# Patient Record
Sex: Female | Born: 1965 | Race: White | Hispanic: No | Marital: Married | Smoking: Current every day smoker
Health system: Southern US, Community
[De-identification: ages and names within clinical notes are randomized; demographics above are authoritative.]

## PROBLEM LIST (undated history)

## (undated) DIAGNOSIS — M549 Dorsalgia, unspecified: Secondary | ICD-10-CM

## (undated) DIAGNOSIS — G8929 Other chronic pain: Secondary | ICD-10-CM

## (undated) HISTORY — PX: TUBAL LIGATION: SHX77

---

## 2011-07-31 ENCOUNTER — Encounter: Payer: Self-pay | Admitting: *Deleted

## 2011-07-31 ENCOUNTER — Emergency Department (HOSPITAL_COMMUNITY)
Admission: EM | Admit: 2011-07-31 | Discharge: 2011-07-31 | Disposition: A | Discharge status: Home / Self Care | Payer: Self-pay | Attending: Emergency Medicine | Admitting: Emergency Medicine

## 2011-07-31 DIAGNOSIS — K029 Dental caries, unspecified: Secondary | ICD-10-CM | POA: Insufficient documentation

## 2011-07-31 DIAGNOSIS — R110 Nausea: Secondary | ICD-10-CM | POA: Insufficient documentation

## 2011-07-31 DIAGNOSIS — K0889 Other specified disorders of teeth and supporting structures: Secondary | ICD-10-CM

## 2011-07-31 DIAGNOSIS — K089 Disorder of teeth and supporting structures, unspecified: Secondary | ICD-10-CM | POA: Insufficient documentation

## 2011-07-31 MED ORDER — HYDROCODONE-ACETAMINOPHEN 5-325 MG PO TABS: 2.0000 | ORAL_TABLET | Freq: Four times a day (QID) | ORAL | Status: AC | PRN

## 2011-07-31 MED ORDER — PENICILLIN V POTASSIUM 250 MG PO TABS
500.0000 mg | ORAL_TABLET | Freq: Once | ORAL | Status: AC
  Administered 2011-07-31: 500 mg via ORAL
  Filled 2011-07-31: qty 2

## 2011-07-31 MED ORDER — PENICILLIN V POTASSIUM 500 MG PO TABS: 500.0000 mg | ORAL_TABLET | Freq: Four times a day (QID) | ORAL | Status: AC

## 2011-07-31 MED ORDER — HYDROCODONE-ACETAMINOPHEN 5-325 MG PO TABS
2.0000 | ORAL_TABLET | Freq: Once | ORAL | Status: AC
  Administered 2011-07-31: 2 via ORAL
  Filled 2011-07-31: qty 2

## 2011-07-31 NOTE — ED Notes (Signed)
Pt presents to department for evaluation of R upper molar toothache. Ongoing for several days. 10/10 pain at the time. No facial swelling. Pt alert and oriented x4. No signs of distress at the time.

## 2011-07-31 NOTE — ED Notes (Signed)
She has a broken tooth and she has had a toothache since last Thursday.  She has an appointment with a dentist next week

## 2011-07-31 NOTE — ED Provider Notes (Signed)
History     CSN: [id number] Arrival date & time: 07/31/2011  3:06 PM   First MD Initiated Contact with Patient 07/31/11 1730      Chief Complaint  Patient presents with  . Dental Pain    (Consider location/radiation/quality/duration/timing/severity/associated sxs/prior treatment) HPI 45 yo female with weeks of right maxillary dental pain associated with dental caries presents with 10/10 pain despite OTC meds.  Patient has no fevers or vomiting.  She has had some nausea but no trismus, difficulty swallowing, or breathing, or facial swelling.  Pain is worse with temperature changes and palpation.  Nothing has made it better.  The patient has an appointment with a dentist next week. History reviewed. No pertinent past medical history.  History reviewed. No pertinent past surgical history.  History reviewed. No pertinent family history.  History  Substance Use Topics  . Smoking status: Current Everyday Smoker  . Smokeless tobacco: Not on file  . Alcohol Use: No    OB History    Grav Para Term Preterm Abortions TAB SAB Ect Mult Living                  Review of Systems  Constitutional: Negative.   HENT: Positive for dental problem.   Eyes: Negative.   Respiratory: Negative.   Cardiovascular: Negative.   Gastrointestinal: Positive for nausea.  Genitourinary: Negative.   Musculoskeletal: Negative.   Skin: Negative.   Neurological: Negative.   Hematological: Negative.   Psychiatric/Behavioral: Negative.   All other systems reviewed and are negative.    Allergies  Sulfa antibiotics  Home Medications   Current Outpatient Rx  Name Route Sig Dispense Refill  . ACETAMINOPHEN 500 MG PO TABS Oral Take 1,000 mg by mouth every 6 (six) hours as needed. For pain      . BENZOCAINE 10 % MT GEL Mouth/Throat Use as directed 1 application in the mouth or throat 2 (two) times daily as needed. For toothache     . IBUPROFEN 200 MG PO TABS Oral Take 400 mg by mouth every 6 (six)  hours as needed. For pain     . HYDROCODONE-ACETAMINOPHEN 5-325 MG PO TABS Oral Take 2 tablets by mouth every 6 (six) hours as needed for pain. 30 tablet 0  . PENICILLIN V POTASSIUM 500 MG PO TABS Oral Take 1 tablet (500 mg total) by mouth 4 (four) times daily. 40 tablet 0    BP 106/63  Pulse 88  Temp(Src) 98 F (36.7 C) (Oral)  Resp 20  SpO2 99%  LMP 06/29/2011  Physical Exam  Nursing note and vitals reviewed. Constitutional: She is oriented to person, place, and time. She appears well-developed and well-nourished. No distress.  HENT:  Head: Normocephalic and atraumatic.  Mouth/Throat: Dental caries present. No dental abscesses.    Eyes: Conjunctivae and EOM are normal. Pupils are equal, round, and reactive to light.  Neck: Normal range of motion.  Neurological: She is alert and oriented to person, place, and time. No cranial nerve deficit. She exhibits normal muscle tone. Coordination normal.  Skin: Skin is warm and dry. No rash noted.  Psychiatric: She has a normal mood and affect.    ED Course  Procedures (including critical care time)  Labs Reviewed - No data to display No results found.   1. Pain, dental       MDM  Patient was evaluated by myself today.  She had dental caries and planned follow-up.  She had tried OTC meds without relief.  Pen  VK was given to prevent infection in the interim and pain meds were given.  A prescription was provided for patient to get through to her appointment next week.  She was discharged home in good condition.        Cyndra Numbers, MD 07/31/11 [phone]

## 2012-01-22 ENCOUNTER — Emergency Department (HOSPITAL_COMMUNITY)
Admission: EM | Admit: 2012-01-22 | Discharge: 2012-01-22 | Disposition: A | Discharge status: Home / Self Care | Payer: Self-pay | Attending: Emergency Medicine | Admitting: Emergency Medicine

## 2012-01-22 ENCOUNTER — Encounter (HOSPITAL_COMMUNITY): Payer: Self-pay

## 2012-01-22 DIAGNOSIS — F17200 Nicotine dependence, unspecified, uncomplicated: Secondary | ICD-10-CM | POA: Insufficient documentation

## 2012-01-22 DIAGNOSIS — R220 Localized swelling, mass and lump, head: Secondary | ICD-10-CM | POA: Insufficient documentation

## 2012-01-22 DIAGNOSIS — K089 Disorder of teeth and supporting structures, unspecified: Secondary | ICD-10-CM | POA: Insufficient documentation

## 2012-01-22 DIAGNOSIS — K029 Dental caries, unspecified: Secondary | ICD-10-CM | POA: Insufficient documentation

## 2012-01-22 MED ORDER — HYDROCODONE-ACETAMINOPHEN 5-325 MG PO TABS: 1.0000 | ORAL_TABLET | ORAL | Status: AC | PRN

## 2012-01-22 MED ORDER — PENICILLIN V POTASSIUM 500 MG PO TABS: 500.0000 mg | ORAL_TABLET | Freq: Three times a day (TID) | ORAL | Status: AC

## 2012-01-22 MED ORDER — HYDROCODONE-ACETAMINOPHEN 5-325 MG PO TABS
1.0000 | ORAL_TABLET | Freq: Once | ORAL | Status: AC
  Administered 2012-01-22: 1 via ORAL
  Filled 2012-01-22: qty 1

## 2012-01-22 MED ORDER — PENICILLIN V POTASSIUM 250 MG PO TABS
500.0000 mg | ORAL_TABLET | Freq: Once | ORAL | Status: AC
  Administered 2012-01-22: 500 mg via ORAL
  Filled 2012-01-22: qty 2

## 2012-01-22 NOTE — ED Provider Notes (Signed)
Medical screening examination/treatment/procedure(s) were performed by non-physician practitioner and as supervising physician I was immediately available for consultation/collaboration.   Dione Booze, MD 01/22/12 [phone]

## 2012-01-22 NOTE — ED Provider Notes (Signed)
History     CSN: [id number]  Arrival date & time 01/22/12  8295   None     Chief Complaint  Patient presents with  . Dental Pain    (Consider location/radiation/quality/duration/timing/severity/associated sxs/prior treatment) Patient is a 46 y.o. female presenting with tooth pain. The history is provided by the patient.  Dental PainThe primary symptoms include mouth pain. Primary symptoms do not include fever. The symptoms began 5 to 7 days ago. The symptoms are worsening. The symptoms occur constantly.  Additional symptoms include: dental sensitivity to temperature, gum swelling, jaw pain, facial swelling and ear pain. Additional symptoms do not include: trouble swallowing and pain with swallowing. Medical issues include: smoking.    History reviewed. No pertinent past medical history.  Past Surgical History  Procedure Date  . Tubal ligation     No family history on file.  History  Substance Use Topics  . Smoking status: Current Everyday Smoker  . Smokeless tobacco: Not on file  . Alcohol Use: No    OB History    Grav Para Term Preterm Abortions TAB SAB Ect Mult Living                  Review of Systems  Constitutional: Negative for fever.  HENT: Positive for ear pain and facial swelling. Negative for trouble swallowing.     Allergies  Sulfa antibiotics  Home Medications   Current Outpatient Rx  Name Route Sig Dispense Refill  . ACETAMINOPHEN 500 MG PO TABS Oral Take 1,000 mg by mouth every 6 (six) hours as needed. For pain      . BC HEADACHE POWDER PO Oral Take 1 packet by mouth 2 (two) times daily as needed. For pain    . BENZOCAINE 10 % MT GEL Mouth/Throat Use as directed 1 application in the mouth or throat 2 (two) times daily as needed. For toothache     . IBUPROFEN 200 MG PO TABS Oral Take 400 mg by mouth every 6 (six) hours as needed. For pain       BP 121/62  Pulse 60  Temp(Src) 97.3 F (36.3 C) (Oral)  Resp 14  SpO2 100%  LMP  01/06/2012  Physical Exam  Constitutional: She appears well-developed and well-nourished. No distress.  HENT:       Partially edentulous. Broad dental decay with severe decay of lower left 2nd molar. Left facial swelling that is mild and follows mandible. No discrete abscess identified.  Neck: Normal range of motion.  Pulmonary/Chest: Effort normal.  Lymphadenopathy:    She has no cervical adenopathy.    ED Course  Procedures (including critical care time)  Labs Reviewed - No data to display No results found.   No diagnosis found.  1. Dental pain 2. Dental caries  MDM  Will treat with abx, pain medication. Encouraged smoking ceasation.        Rodena Medin, PA-C 01/22/12 0800

## 2012-01-22 NOTE — Discharge Instructions (Signed)
FOLLOW UP WITH A DENTIST OF YOUR CHOICE FOR FURTHER DENTAL CARE. TAKE MEDICATIONS AS PRESCRIBED. RETURN HERE WITH ANY SEVERE PAIN, HIGH FEVER.   Dental Caries Dental caries (cavities) are areas of tooth decay. Cavities are usually caused by a combination of poor dental care; sugar; tobacco, alcohol, and drug abuse; decreased saliva production; and receding gums. If cavities are not treated by a dentist, they grow in size. This can cause toothaches, infection, and loss of the tooth. Cavities of the outer tooth enamel do not cause symptoms. Dental pain from cold drinks may be the first sign the enamel has broken down and decay has spread toward the root of the tooth. This can cause the tooth to die or become infected. If a cavity is treated before it causes toothache, the tooth can usually be saved. Cavities can be prevented by good oral hygiene. Brushing your teeth in the morning and before bed, and using dental floss once daily helps remove plaque and reduce bacteria. Candy, soft drinks, and other sources of sugar promote tooth decay by promoting the growth of bacteria in the mouth. Proper diet, fluoride, dental cleaning, and fillings are important in preventing the loss of teeth from decay. Antibiotics, root canal treatment, or dental extraction may be needed if the decay is severe. Take any pain medication or antibiotics as directed by your caregiver. It is important that you follow up with a dentist for definitive care. SEEK MEDICAL CARE IF:   You or your child has an oral temperature above 102 F (38.9 C).   There is difficulty opening the mouth.   There is difficulty swallowing or handling secretions.   There is difficulty breathing.   There is chest pain.   There are worsening or concerning symptoms.  Document Released: 09/27/2004 Document Revised: 08/09/2011 Document Reviewed: 12/13/2009 [facility] Patient Information 2012 [place], [place].

## 2012-01-22 NOTE — ED Notes (Signed)
Patient presents with left lower molar tooth pain since last Wednesday; patient has a dentist appointment scheduled on June 2nd to have tooth removed.  Patient taking advil, tylenol, and goody powder at home without relief.

## 2012-04-07 ENCOUNTER — Emergency Department (HOSPITAL_COMMUNITY)
Admission: EM | Admit: 2012-04-07 | Discharge: 2012-04-07 | Disposition: A | Discharge status: Home / Self Care | Payer: Self-pay | Attending: Emergency Medicine | Admitting: Emergency Medicine

## 2012-04-07 ENCOUNTER — Encounter (HOSPITAL_COMMUNITY): Payer: Self-pay

## 2012-04-07 DIAGNOSIS — M549 Dorsalgia, unspecified: Secondary | ICD-10-CM

## 2012-04-07 DIAGNOSIS — F17200 Nicotine dependence, unspecified, uncomplicated: Secondary | ICD-10-CM | POA: Insufficient documentation

## 2012-04-07 DIAGNOSIS — M545 Low back pain, unspecified: Secondary | ICD-10-CM | POA: Insufficient documentation

## 2012-04-07 DIAGNOSIS — Z882 Allergy status to sulfonamides status: Secondary | ICD-10-CM | POA: Insufficient documentation

## 2012-04-07 MED ORDER — ONDANSETRON 4 MG PO TBDP
8.0000 mg | ORAL_TABLET | Freq: Once | ORAL | Status: AC
  Administered 2012-04-07: 8 mg via ORAL
  Filled 2012-04-07: qty 2

## 2012-04-07 MED ORDER — DEXAMETHASONE SODIUM PHOSPHATE 10 MG/ML IJ SOLN
10.0000 mg | Freq: Once | INTRAMUSCULAR | Status: AC
  Administered 2012-04-07: 10 mg via INTRAMUSCULAR
  Filled 2012-04-07: qty 1

## 2012-04-07 MED ORDER — CYCLOBENZAPRINE HCL 10 MG PO TABS: 10.0000 mg | ORAL_TABLET | Freq: Two times a day (BID) | ORAL | Status: AC | PRN

## 2012-04-07 MED ORDER — OXYCODONE-ACETAMINOPHEN 5-325 MG PO TABS: 2.0000 | ORAL_TABLET | ORAL | Status: AC | PRN

## 2012-04-07 MED ORDER — HYDROMORPHONE HCL PF 1 MG/ML IJ SOLN
1.0000 mg | Freq: Once | INTRAMUSCULAR | Status: AC
  Administered 2012-04-07: 1 mg via INTRAMUSCULAR
  Filled 2012-04-07: qty 1
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
## 2012-08-23 ENCOUNTER — Emergency Department (HOSPITAL_COMMUNITY)
Admission: EM | Admit: 2012-08-23 | Discharge: 2012-08-23 | Disposition: A | Discharge status: Home / Self Care | Payer: Self-pay | Attending: Emergency Medicine | Admitting: Emergency Medicine

## 2012-08-23 ENCOUNTER — Encounter (HOSPITAL_COMMUNITY): Payer: Self-pay | Admitting: Family Medicine

## 2012-08-23 DIAGNOSIS — K029 Dental caries, unspecified: Secondary | ICD-10-CM | POA: Insufficient documentation

## 2012-08-23 DIAGNOSIS — F17200 Nicotine dependence, unspecified, uncomplicated: Secondary | ICD-10-CM | POA: Insufficient documentation

## 2012-08-23 DIAGNOSIS — Z9851 Tubal ligation status: Secondary | ICD-10-CM | POA: Insufficient documentation

## 2012-08-23 DIAGNOSIS — H9209 Otalgia, unspecified ear: Secondary | ICD-10-CM | POA: Insufficient documentation

## 2012-08-23 MED ORDER — PENICILLIN V POTASSIUM 250 MG PO TABS: 250.0000 mg | ORAL_TABLET | Freq: Four times a day (QID) | ORAL | Status: AC

## 2012-08-23 MED ORDER — OXYCODONE-ACETAMINOPHEN 5-325 MG PO TABS: 1.0000 | ORAL_TABLET | Freq: Four times a day (QID) | ORAL | Status: DC | PRN

## 2012-08-23 NOTE — ED Provider Notes (Addendum)
History     CSN: [id number]  Arrival date & time 08/23/12  2956   First MD Initiated Contact with Patient 08/23/12 0830      Chief Complaint  Patient presents with  . Dental Pain    (Consider location/radiation/quality/duration/timing/severity/associated sxs/prior treatment) Patient is a 46 y.o. female presenting with tooth pain. The history is provided by the patient.  Dental PainThe primary symptoms include mouth pain. Primary symptoms do not include fever, shortness of breath, sore throat or angioedema. Episode onset: 3 days ago. The symptoms are worsening. The symptoms are new. The symptoms occur constantly.  Additional symptoms include: dental sensitivity to temperature, gum tenderness, jaw pain and ear pain. Additional symptoms do not include: gum swelling, trismus, facial swelling, trouble swallowing and drooling. Medical issues include: smoking and periodontal disease.    History reviewed. No pertinent past medical history.  Past Surgical History  Procedure Date  . Tubal ligation     History reviewed. No pertinent family history.  History  Substance Use Topics  . Smoking status: Current Every Day Smoker  . Smokeless tobacco: Not on file  . Alcohol Use: No    OB History    Grav Para Term Preterm Abortions TAB SAB Ect Mult Living                  Review of Systems  Constitutional: Negative for fever.  HENT: Positive for ear pain. Negative for sore throat, facial swelling, drooling and trouble swallowing.   Respiratory: Negative for shortness of breath.   All other systems reviewed and are negative.    Allergies  Sulfa antibiotics  Home Medications   Current Outpatient Rx  Name  Route  Sig  Dispense  Refill  . IBUPROFEN 200 MG PO TABS   Oral   Take 400 mg by mouth every 6 (six) hours as needed. For pain            BP 139/57  Pulse 73  Temp 98.1 F (36.7 C) (Oral)  Resp 22  SpO2 100%  Physical Exam  Nursing note and vitals  reviewed. Constitutional: She is oriented to person, place, and time. She appears well-developed and well-nourished. She appears distressed.  HENT:  Head: Normocephalic.  Mouth/Throat: Mucous membranes are normal. Dental caries present. No oropharyngeal exudate, posterior oropharyngeal edema, posterior oropharyngeal erythema or tonsillar abscesses.    Eyes: EOM are normal. Pupils are equal, round, and reactive to light.  Lymphadenopathy:    She has no cervical adenopathy.  Neurological: She is alert and oriented to person, place, and time.  Skin: Skin is warm and dry.  Psychiatric: She has a normal mood and affect. Her behavior is normal.    ED Course  Dental Date/Time: 08/23/2012 8:38 AM Performed by: Gwyneth Sprout Authorized by: Gwyneth Sprout Consent: Verbal consent obtained. Risks and benefits: risks, benefits and alternatives were discussed Consent given by: patient Patient identity confirmed: verbally with patient Local anesthesia used: yes Anesthesia: local infiltration (apical block) Local anesthetic: bupivacaine 0.5% with epinephrine Anesthetic total: 2 ml Patient sedated: no Patient tolerance: Patient tolerated the procedure well with no immediate complications. Comments: Significant pain relief   (including critical care time)  Labs Reviewed - No data to display No results found.   1. Dental caries       MDM   Pt with dental caries and no facial swelling.  No signs of ludwig's angina or difficulty swallowing and no systemic symptoms. Will treat with PCN and have pt f/u with  dentist.         Gwyneth Sprout, MD 08/23/12 1610  Gwyneth Sprout, MD 08/23/12 [id number]  Gwyneth Sprout, MD 08/23/12 [id number]

## 2012-08-23 NOTE — ED Notes (Signed)
Per pt sts right sided toothache since Thursday. sts she has been up all night.

## 2012-10-04 ENCOUNTER — Encounter (HOSPITAL_COMMUNITY): Payer: Self-pay | Admitting: Nurse Practitioner

## 2012-10-04 ENCOUNTER — Emergency Department (HOSPITAL_COMMUNITY)
Admission: EM | Admit: 2012-10-04 | Discharge: 2012-10-04 | Disposition: A | Discharge status: Home / Self Care | Payer: Self-pay | Attending: Emergency Medicine | Admitting: Emergency Medicine

## 2012-10-04 DIAGNOSIS — F17200 Nicotine dependence, unspecified, uncomplicated: Secondary | ICD-10-CM | POA: Insufficient documentation

## 2012-10-04 DIAGNOSIS — G5601 Carpal tunnel syndrome, right upper limb: Secondary | ICD-10-CM

## 2012-10-04 DIAGNOSIS — R209 Unspecified disturbances of skin sensation: Secondary | ICD-10-CM | POA: Insufficient documentation

## 2012-10-04 DIAGNOSIS — G5600 Carpal tunnel syndrome, unspecified upper limb: Secondary | ICD-10-CM | POA: Insufficient documentation

## 2012-10-04 MED ORDER — METHYLPREDNISOLONE 4 MG PO KIT: PACK | ORAL | Status: DC

## 2012-10-04 MED ORDER — OXYCODONE-ACETAMINOPHEN 5-325 MG PO TABS
1.0000 | ORAL_TABLET | Freq: Once | ORAL | Status: AC
  Administered 2012-10-04: 1 via ORAL
  Filled 2012-10-04: qty 1

## 2012-10-04 MED ORDER — OXYCODONE-ACETAMINOPHEN 5-325 MG PO TABS: 1.0000 | ORAL_TABLET | Freq: Four times a day (QID) | ORAL | Status: DC | PRN

## 2012-10-04 NOTE — ED Provider Notes (Signed)
History     CSN: [id number]  Arrival date & time 10/04/12  1556   First MD Initiated Contact with Patient 10/04/12 1604      Chief Complaint  Patient presents with  . Hand Pain    (Consider location/radiation/quality/duration/timing/severity/associated sxs/prior treatment) Patient is a 47 y.o. female presenting with hand pain. The history is provided by the patient.  Hand Pain This is a chronic problem.   patient has had hand pain for several months. Worse last night. 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 She states her difficulty sleeping due to the pain. She states it is in the middle of her hand goes to the tips of her fingers. No trauma. She states the pain will go all way up to her elbow. It is worse with movement. No fevers. No cough.  History reviewed. No pertinent past medical history.  Past Surgical History  Procedure Date  . Tubal ligation     History reviewed. No pertinent family history.  History  Substance Use Topics  . Smoking status: Current Every Day Smoker  . Smokeless tobacco: Not on file  . Alcohol Use: No    OB History    Grav Para Term Preterm Abortions TAB SAB Ect Mult Living                  Review of Systems  Constitutional: Negative for fever and fatigue.  Musculoskeletal: Negative for myalgias, back pain and joint swelling.  Skin: Negative for rash and wound.  Neurological: Positive for numbness. Negative for seizures and weakness.    Allergies  Sulfa antibiotics  Home Medications  No current outpatient prescriptions on file.  BP 130/85  Pulse 61  Temp 98.6 F (37 C) (Oral)  Resp 16  SpO2 100%  Physical Exam  Constitutional: She is oriented to person, place, and time. She appears well-developed and well-nourished.  HENT:  Head: Normocephalic.  Musculoskeletal: She exhibits tenderness.       Tenderness over volar aspect of right wrist. Worse with flexion at the wrist. Some pain with movement of the fingers. She has good  extension at this flexion of the fingers. Sensation appears grossly intact. Her pain is over the median distribution. Good capillary refill. Strong radial pulse. Mild tenderness over medial elbow.  Neurological: She is alert and oriented to person, place, and time.  Skin: Skin is warm and dry. No rash noted. No erythema.    ED Course  Procedures (including critical care time)  Labs Reviewed - No data to display No results found.   1. Carpal tunnel syndrome of right wrist       MDM  Patient with wrist pain. Likely carpal tunnel. We'll treat with steroids and pain medication. She'll be splinted and will followup with Ortho Hand.        Juliet Rude. Rubin Payor, MD 10/04/12 [phone]

## 2012-10-04 NOTE — Progress Notes (Signed)
Orthopedic Tech Progress Note Patient Details:  Latoya Davenport 07/09/1966 [id number] Velcro wrist splint applied to Right wrist. Tolerated well. Ortho Devices Type of Ortho Device: Velcro wrist splint Ortho Device/Splint Location: Right Ortho Device/Splint Interventions: Application   Asia R Thompson 10/04/2012, 4:32 PM

## 2012-10-04 NOTE — ED Notes (Addendum)
Pt c/o pain and numbness in right hand for several months. Pt states it became markedly worse last night and pt had trouble sleeping. NAD noted. Pt able to move hands, just causes pain. Pt jumped when I touched right hand but then states "I can't even feel it."

## 2012-10-04 NOTE — ED Notes (Signed)
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 Does not remember injuring hand. Cms intact

## 2012-10-04 NOTE — ED Notes (Signed)
MD at bedside. 

## 2013-03-05 ENCOUNTER — Other Ambulatory Visit: Payer: Self-pay | Admitting: Obstetrics and Gynecology

## 2013-03-05 DIAGNOSIS — Z1231 Encounter for screening mammogram for malignant neoplasm of breast: Secondary | ICD-10-CM

## 2013-03-17 ENCOUNTER — Ambulatory Visit (HOSPITAL_COMMUNITY): Payer: Self-pay | Attending: Obstetrics and Gynecology

## 2013-03-17 ENCOUNTER — Ambulatory Visit (HOSPITAL_COMMUNITY): Payer: Self-pay

## 2013-04-20 ENCOUNTER — Encounter (HOSPITAL_COMMUNITY): Payer: Self-pay | Admitting: *Deleted

## 2013-04-20 ENCOUNTER — Emergency Department (HOSPITAL_COMMUNITY)
Admission: EM | Admit: 2013-04-20 | Discharge: 2013-04-20 | Disposition: A | Discharge status: Home / Self Care | Payer: Self-pay | Attending: Emergency Medicine | Admitting: Emergency Medicine

## 2013-04-20 DIAGNOSIS — M549 Dorsalgia, unspecified: Secondary | ICD-10-CM | POA: Insufficient documentation

## 2013-04-20 DIAGNOSIS — F17200 Nicotine dependence, unspecified, uncomplicated: Secondary | ICD-10-CM | POA: Insufficient documentation

## 2013-04-20 DIAGNOSIS — G8929 Other chronic pain: Secondary | ICD-10-CM | POA: Insufficient documentation

## 2013-04-20 DIAGNOSIS — R220 Localized swelling, mass and lump, head: Secondary | ICD-10-CM | POA: Insufficient documentation

## 2013-04-20 HISTORY — DX: Dorsalgia, unspecified: M54.9

## 2013-04-20 HISTORY — DX: Other chronic pain: G89.29

## 2013-04-20 MED ORDER — METHYLPREDNISOLONE 4 MG PO KIT: PACK | ORAL | Status: DC

## 2013-04-20 MED ORDER — HYDROCODONE-ACETAMINOPHEN 5-325 MG PO TABS: 2.0000 | ORAL_TABLET | Freq: Four times a day (QID) | ORAL | Status: DC | PRN

## 2013-04-20 MED ORDER — KETOROLAC TROMETHAMINE 60 MG/2ML IM SOLN
60.0000 mg | Freq: Once | INTRAMUSCULAR | Status: AC
  Administered 2013-04-20: 60 mg via INTRAMUSCULAR
  Filled 2013-04-20: qty 2
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
## 2013-04-20 NOTE — ED Provider Notes (Signed)
CSN: [id number]     Arrival date & time 04/20/13  1248 History     First MD Initiated Contact with Patient 04/20/13 1300     Chief Complaint  Patient presents with  . Back Pain   (Consider location/radiation/quality/duration/timing/severity/associated sxs/prior Treatment) HPI Comments: Patient presents emergency department with chief complaint of back pain. She states that she is having this back pain before. She states the pain is moderate to severe. It is worse with bending. She is tried taking ibuprofen with no relief. The pain radiates down her left leg, but she is able to ambulate. She denies any bowel or bladder incontinence, or saddle anesthesia.  Additionally, she states that she has not on the right side of her cheek, which is been there for 3 weeks. She is concerned about cancer, as her mother-in-law had face cancer.  The history is provided by the patient. No language interpreter was used.    Past Medical History  Diagnosis Date  . Back pain, chronic    Past Surgical History  Procedure Laterality Date  . Tubal ligation     No family history on file. History  Substance Use Topics  . Smoking status: Current Every Day Smoker  . Smokeless tobacco: Not on file  . Alcohol Use: No   OB History   Grav Para Term Preterm Abortions TAB SAB Ect Mult Living                 Review of Systems  All other systems reviewed and are negative.    Allergies  Sulfa antibiotics  Home Medications   Current Outpatient Rx  Name  Route  Sig  Dispense  Refill  . ibuprofen (ADVIL,MOTRIN) 100 MG tablet   Oral   Take 200 mg by mouth every 6 (six) hours as needed for pain.         . naproxen sodium (ANAPROX) 220 MG tablet   Oral   Take 220 mg by mouth 2 (two) times daily with a meal.         . HYDROcodone-acetaminophen (NORCO/VICODIN) 5-325 MG per tablet   Oral   Take 2 tablets by mouth every 6 (six) hours as needed for pain.   15 tablet   0   . methylPREDNISolone  (MEDROL, PAK,) 4 MG tablet      follow package directions   21 tablet   0    BP 98/77  Pulse 60  Temp(Src) 98.2 F (36.8 C) (Oral)  Resp 18  SpO2 100% Physical Exam  Nursing note and vitals reviewed. Constitutional: She is oriented to person, place, and time. She appears well-developed and well-nourished. No distress.  HENT:  Head: Normocephalic and atraumatic.  Mass on right cheek bone, mildly tender to palpation, but no evidence of abscess or infection  Eyes: Conjunctivae and EOM are normal. Right eye exhibits no discharge. Left eye exhibits no discharge. No scleral icterus.  Neck: Normal range of motion. Neck supple. No tracheal deviation present.  Cardiovascular: Normal rate, regular rhythm and normal heart sounds.  Exam reveals no gallop and no friction rub.   No murmur heard. Pulmonary/Chest: Effort normal and breath sounds normal. No respiratory distress. She has no wheezes.  Abdominal: Soft. She exhibits no distension. There is no tenderness.  Musculoskeletal: Normal range of motion.  Lumbar paraspinal muscles tender to palpation, no bony tenderness, step-offs, or gross abnormality or deformity of spine, patient is able to ambulate, moves all extremities  Neurological: She is alert and oriented to  person, place, and time.  Sensation and strength intact bilaterally  Skin: Skin is warm. She is not diaphoretic.  Psychiatric: She has a normal mood and affect. Her behavior is normal. Judgment and thought content normal.    ED Course   Procedures (including critical care time)  Labs Reviewed - No data to display No results found. 1. Back pain   2. Swelling, mass, or lump on face     MDM  Patient with back pain. Will treat with prednisone and pain medicine. Also will encourage followup for fine needle biopsy of the mass on the patient's face. Have been discussed with the patient, and she has been given appropriate followup. Vision has been seen by and discussed with Dr.  Rubin Payor, who agrees with the plan. She is stable and ready for discharge.  Patient with back pain.  No neurological deficits and normal neuro exam.  Patient can walk but states is painful.  No loss of bowel or bladder control.  No concern for cauda equina.  No fever, night sweats, weight loss, h/o cancer, IVDU.  RICE protocol and pain medicine indicated and discussed with patient.    Roxy Horseman, PA-C 04/20/13 1524

## 2013-04-20 NOTE — ED Notes (Signed)
Pt reports that she has had back pain like this for awhile; was seen here. Took pain meds and they seemed to ease off the pain. Flexeril was non helpful. Pt reports the pain is central to her lower back then radiates down to left leg, denies urinary symptoms.

## 2013-04-20 NOTE — ED Notes (Signed)
MD at bedside. 

## 2013-04-20 NOTE — ED Notes (Signed)
PT ambulated with baseline gait; VSS; A&Ox3; no signs of distress; respirations even and unlabored; skin warm and dry; no questions upon discharge.  

## 2013-04-21 NOTE — ED Provider Notes (Signed)
Medical screening examination/treatment/procedure(s) were performed by non-physician practitioner and as supervising physician I was immediately available for consultation/collaboration.  Koleton Duchemin R. Yojan Paskett, MD 04/21/13 1532 

## 2013-07-01 ENCOUNTER — Emergency Department (HOSPITAL_COMMUNITY)
Admission: EM | Admit: 2013-07-01 | Discharge: 2013-07-01 | Disposition: A | Discharge status: Home / Self Care | Payer: Self-pay | Attending: Emergency Medicine | Admitting: Emergency Medicine

## 2013-07-01 ENCOUNTER — Encounter (HOSPITAL_COMMUNITY): Payer: Self-pay | Admitting: Emergency Medicine

## 2013-07-01 DIAGNOSIS — F17200 Nicotine dependence, unspecified, uncomplicated: Secondary | ICD-10-CM | POA: Insufficient documentation

## 2013-07-01 DIAGNOSIS — K089 Disorder of teeth and supporting structures, unspecified: Secondary | ICD-10-CM | POA: Insufficient documentation

## 2013-07-01 DIAGNOSIS — K0889 Other specified disorders of teeth and supporting structures: Secondary | ICD-10-CM

## 2013-07-01 DIAGNOSIS — G8929 Other chronic pain: Secondary | ICD-10-CM | POA: Insufficient documentation

## 2013-07-01 DIAGNOSIS — H9209 Otalgia, unspecified ear: Secondary | ICD-10-CM | POA: Insufficient documentation

## 2013-07-01 MED ORDER — OXYCODONE-ACETAMINOPHEN 10-325 MG PO TABS: 0.5000 | ORAL_TABLET | ORAL | Status: DC | PRN

## 2013-07-01 MED ORDER — AMOXICILLIN 500 MG PO CAPS: 500.0000 mg | ORAL_CAPSULE | Freq: Three times a day (TID) | ORAL | Status: DC

## 2013-07-01 NOTE — ED Provider Notes (Signed)
CSN: [id number]     Arrival date & time 07/01/13  1191 History  This chart was scribed for non-physician practitioner Arthor Captain, PA-C working with Shelda Jakes, MD by Valera Castle, ED scribe. This patient was seen in room TR05C/TR05C and the patient's care was started at 10:53 AM.    Chief Complaint  Patient presents with  . Dental Pain   The history is provided by the patient. No language interpreter was used.   HPI Comments: Latoya Davenport is a 47 y.o. female who presents to the Emergency Department complaining of sudden, moderate, constant, left, lower dental pain, onset 3 days ago. She reports associated left ear pain and left sided facial pain. She reports being at the dentist 1 month ago and was told she needed her teeth extracted. She reports that she is saving her money at the moment and cannot afford the procedure. She reports that the pain has been keeping her awake at night. She reports taking Ibuprofen, with little relief. She denies dysphagia, fever, and any other associated symptoms. She has a h/o chronic back pain, but denies any other medical history.   Past Medical History  Diagnosis Date  . Back pain, chronic    Past Surgical History  Procedure Laterality Date  . Tubal ligation     History reviewed. No pertinent family history. History  Substance Use Topics  . Smoking status: Current Every Day Smoker  . Smokeless tobacco: Not on file  . Alcohol Use: No   OB History   Grav Para Term Preterm Abortions TAB SAB Ect Mult Living                 Review of Systems  HENT: Positive for dental problem (left, lower dental pain) and ear pain. Negative for trouble swallowing.        Positive for left sided facial pain  All other systems reviewed and are negative.   Allergies  Sulfa antibiotics  Home Medications   Current Outpatient Rx  Name  Route  Sig  Dispense  Refill  . ibuprofen (ADVIL,MOTRIN) 200 MG tablet   Oral   Take 400 mg by mouth every 6 (six)  hours as needed for pain.          Triage Vitals: BP 103/49  Pulse 55  Temp(Src) 97.4 F (36.3 C) (Oral)  Resp 18  SpO2 100%  Physical Exam  Nursing note and vitals reviewed. Constitutional: She is oriented to person, place, and time. She appears well-developed and well-nourished. No distress.  HENT:  Head: Normocephalic and atraumatic.  Right Ear: Tympanic membrane, external ear and ear canal normal.  Left Ear: External ear and ear canal normal.  1st, 2nd, 3rd molars on left lower side broken. Dental carries. No gingival erythema or induration. No overt abscess present. Fluid present behind left TM.  Eyes: EOM are normal.  Neck: Neck supple. No tracheal deviation present.  Cardiovascular: Normal rate.   Pulmonary/Chest: Effort normal. No respiratory distress.  Musculoskeletal: Normal range of motion.  Neurological: She is alert and oriented to person, place, and time.  Skin: Skin is warm and dry.  Psychiatric: She has a normal mood and affect. Her behavior is normal.    ED Course  Procedures (including critical care time)  DIAGNOSTIC STUDIES: Oxygen Saturation is 100% on room air, normal by my interpretation.    COORDINATION OF CARE: 10:57 AM-Discussed treatment plan with pt at bedside and pt agreed to plan.   Labs Review Labs Reviewed -  No data to display Imaging Review No results found.  EKG Interpretation   None      Meds ordered this encounter  Medications  . ibuprofen (ADVIL,MOTRIN) 200 MG tablet    Sig: Take 400 mg by mouth every 6 (six) hours as needed for pain.  Dental Performed by: Arthor Captain Authorized by: Arthor Captain Consent: Verbal consent obtained. Patient understanding: patient states understanding of the procedure being performed Patient identity confirmed: verbally with patient Local anesthesia used: yes Local anesthetic: bupivacaine 0.5% with epinephrine Anesthetic total: 0.52ml Patient sedated: no Patient tolerance: Patient  tolerated the procedure well with no immediate complications.      MDM   1. Pain, dental    Patient with toothache.  No gross abscess.  Exam unconcerning for Ludwig's angina or spread of infection.  Will treat with penicillin and pain medicine.  Urged patient to follow-up with dentist.       I personally performed the services described in this documentation, which was scribed in my presence. The recorded information has been reviewed and is accurate.    Arthor Captain, PA-C 07/01/13 2044

## 2013-07-01 NOTE — ED Notes (Signed)
Pt c/o left lower dental pain x 3 days.

## 2013-07-01 NOTE — ED Notes (Signed)
C/o left lower dental pain. States was seen by a dentist 1 month ago but could not afford to have tooth treated. Pain started again yesterday.

## 2013-07-03 NOTE — ED Provider Notes (Signed)
Medical screening examination/treatment/procedure(s) were performed by non-physician practitioner and as supervising physician I was immediately available for consultation/collaboration.  EKG Interpretation   None         Shelda Jakes, MD 07/03/13 [phone]

## 2013-08-05 ENCOUNTER — Emergency Department (HOSPITAL_COMMUNITY)
Admission: EM | Admit: 2013-08-05 | Discharge: 2013-08-05 | Disposition: A | Discharge status: Home / Self Care | Payer: Self-pay | Attending: Emergency Medicine | Admitting: Emergency Medicine

## 2013-08-05 ENCOUNTER — Encounter (HOSPITAL_COMMUNITY): Payer: Self-pay | Admitting: Emergency Medicine

## 2013-08-05 DIAGNOSIS — K089 Disorder of teeth and supporting structures, unspecified: Secondary | ICD-10-CM | POA: Insufficient documentation

## 2013-08-05 DIAGNOSIS — K0889 Other specified disorders of teeth and supporting structures: Secondary | ICD-10-CM

## 2013-08-05 DIAGNOSIS — K0381 Cracked tooth: Secondary | ICD-10-CM | POA: Insufficient documentation

## 2013-08-05 DIAGNOSIS — G8929 Other chronic pain: Secondary | ICD-10-CM | POA: Insufficient documentation

## 2013-08-05 DIAGNOSIS — F17200 Nicotine dependence, unspecified, uncomplicated: Secondary | ICD-10-CM | POA: Insufficient documentation

## 2013-08-05 DIAGNOSIS — K029 Dental caries, unspecified: Secondary | ICD-10-CM | POA: Insufficient documentation

## 2013-08-05 DIAGNOSIS — M549 Dorsalgia, unspecified: Secondary | ICD-10-CM | POA: Insufficient documentation

## 2013-08-05 MED ORDER — OXYCODONE-ACETAMINOPHEN 5-325 MG PO TABS: 1.0000 | ORAL_TABLET | Freq: Four times a day (QID) | ORAL | Status: DC | PRN

## 2013-08-05 MED ORDER — AMOXICILLIN 500 MG PO CAPS: 500.0000 mg | ORAL_CAPSULE | Freq: Three times a day (TID) | ORAL | Status: DC

## 2013-08-05 NOTE — ED Notes (Signed)
Pt c/o dental pain. Was here last month for same. States she "didn't have the money to get them pulled".

## 2013-08-05 NOTE — ED Notes (Signed)
Left lower tooth pain x 3 days 

## 2013-08-05 NOTE — ED Provider Notes (Signed)
  Medical screening examination/treatment/procedure(s) were performed by non-physician practitioner and as supervising physician I was immediately available for consultation/collaboration.  EKG Interpretation   None          Gerhard Munch, MD 08/05/13 1645

## 2013-08-05 NOTE — ED Provider Notes (Signed)
CSN: [id number]     Arrival date & time 08/05/13  4098 History   First MD Initiated Contact with Patient 08/05/13 0859     This chart was scribed for Marlon Pel, by Ladona Ridgel Day, ED scribe. This patient was seen in room TR06C/TR06C and the patient's care was started at 0859.  Chief Complaint  Patient presents with  . Dental Pain   Patient is a 47 y.o. female presenting with tooth pain. The history is provided by the patient. No language interpreter was used.  Dental Pain Location:  Lower Quality:  Aching Severity:  Moderate Onset quality:  Gradual Duration:  3 days Timing:  Constant Progression:  Worsening Chronicity:  Chronic Context: dental caries, dental fracture and poor dentition   Relieved by:  Nothing Worsened by:  Pressure and touching Ineffective treatments:  Acetaminophen, ice, NSAIDs and topical anesthetic gel Associated symptoms: no congestion, no fever and no trismus    HPI Comments: Latoya Davenport is a 47 y.o. female who presents to the Emergency Department complaining of constant, gradually worsened left lower dental pain, 3 days ago. She reports has been seen here previously for same problem when the same tooth was fractured and was having dental pain. She reports associated swelling and pain localized to area of left lower fractured tooth. She denies any fever/chills. She states pain radiates to her left ear at times. She reports tried tylenol, ibuprofen, ice packs, oragel w/out any relief. She reports eating soup and crackers for soft food but is having pain while eating anything that is hard food. She states this episode of dental pain is worse than previous episodes.   She saw a dentist at [facility] who says the tooth can not be fixed and the two teeth need to be pulled. She says it will cost $600 and she can not afford that at this time. Pt holding ice pack to face and rocking back and forth.     Past Medical History  Diagnosis Date  . Back pain, chronic     Past Surgical History  Procedure Laterality Date  . Tubal ligation     No family history on file. History  Substance Use Topics  . Smoking status: Current Every Day Smoker  . Smokeless tobacco: Not on file  . Alcohol Use: No   OB History   Grav Para Term Preterm Abortions TAB SAB Ect Mult Living                 Review of Systems  Constitutional: Negative for fever and chills.  HENT: Positive for dental problem (lef lower dental pain). Negative for congestion and rhinorrhea.   Respiratory: Negative for cough and shortness of breath.   Cardiovascular: Negative for chest pain.  Gastrointestinal: Negative for nausea, vomiting, abdominal pain and diarrhea.  Musculoskeletal: Negative for back pain.  Skin: Negative for color change and rash.  Neurological: Negative for syncope.  All other systems reviewed and are negative.   A complete 10 system review of systems was obtained and all systems are negative except as noted in the HPI and PMH.   Allergies  Sulfa antibiotics  Home Medications   Current Outpatient Rx  Name  Route  Sig  Dispense  Refill  . ibuprofen (ADVIL,MOTRIN) 200 MG tablet   Oral   Take 400 mg by mouth daily as needed (for tooth pain.).          Marland Kitchen amoxicillin (AMOXIL) 500 MG capsule   Oral   Take 1  capsule (500 mg total) by mouth 3 (three) times daily.   21 capsule   0   . oxyCODONE-acetaminophen (PERCOCET/ROXICET) 5-325 MG per tablet   Oral   Take 1 tablet by mouth every 6 (six) hours as needed for severe pain.   20 tablet   0    Triage Vitals: BP 110/54  Pulse 88  Temp(Src) 98.1 F (36.7 C)  Resp 20  SpO2 100% Physical Exam  Nursing note and vitals reviewed. Constitutional: She is oriented to person, place, and time. She appears well-developed and well-nourished. No distress.  HENT:  Head: Normocephalic and atraumatic.  Right Ear: External ear normal.  Left Ear: External ear normal.  Mouth/Throat: Uvula is midline, oropharynx is clear  and moist and mucous membranes are normal. Normal dentition. Dental caries (Pts tooth shows no obvious abscess but moderate to severe tenderness to palpation of marked tooth) present. No uvula swelling.    Wide spread dental decay  Eyes: Conjunctivae are normal. Pupils are equal, round, and reactive to light. Right eye exhibits no discharge. Left eye exhibits no discharge.  Neck: Trachea normal, normal range of motion and full passive range of motion without pain. Neck supple. No tracheal deviation present.  Cardiovascular: Normal rate, regular rhythm, normal heart sounds and normal pulses.   Pulmonary/Chest: Effort normal and breath sounds normal. No respiratory distress. Chest wall is not dull to percussion. She exhibits no tenderness, no crepitus, no edema, no deformity and no retraction.  Abdominal: Normal appearance.  Musculoskeletal: Normal range of motion. She exhibits no edema.  Neurological: She is alert and oriented to person, place, and time. She has normal strength.  Skin: Skin is warm, dry and intact. She is not diaphoretic.  Psychiatric: She has a normal mood and affect. Her speech is normal. Thought content normal. Cognition and memory are normal.    ED Course  Dental Date/Time: 08/05/2013 10:18 AM Performed by: Dorthula Matas Authorized by: Dorthula Matas Consent: Verbal consent obtained. Risks and benefits: risks, benefits and alternatives were discussed Consent given by: patient and spouse Local anesthesia used: yes Anesthesia: nerve block Local anesthetic: bupivacaine 0.25% with epinephrine Patient sedated: no Patient tolerance: Patient tolerated the procedure well with no immediate complications. Comments: Dental block successful with pain relief   (including critical care time) DIAGNOSTIC STUDIES: Oxygen Saturation is 100% on room air, normal by my interpretation.    COORDINATION OF CARE: At 1000 AM Discussed treatment plan with patient which includes pain  medicine and antibiotics. Patient agrees.   Labs Review Labs Reviewed - No data to display Imaging Review No results found.  EKG Interpretation   None       MDM   1. Toothache    Patient has dental pain. No emergent s/sx's present. Patent airway. No trismus.  Will be given pain medication and antibiotics. I discussed the need to call dentist within 24/48 hours for follow-up. Dental referral given. Return to ED precautions given.  Pt voiced understanding and has agreed to follow-up.   I personally performed the services described in this documentation, which was scribed in my presence. The recorded information has been reviewed and is accurate.     Dorthula Matas, PA-C 08/05/13 1019

## 2013-10-22 ENCOUNTER — Emergency Department (HOSPITAL_COMMUNITY)
Admission: EM | Admit: 2013-10-22 | Discharge: 2013-10-22 | Disposition: A | Discharge status: Home / Self Care | Payer: Self-pay | Attending: Emergency Medicine | Admitting: Emergency Medicine

## 2013-10-22 ENCOUNTER — Emergency Department (HOSPITAL_COMMUNITY): Payer: Self-pay

## 2013-10-22 ENCOUNTER — Encounter (HOSPITAL_COMMUNITY): Payer: Self-pay | Admitting: Emergency Medicine

## 2013-10-22 DIAGNOSIS — R109 Unspecified abdominal pain: Secondary | ICD-10-CM

## 2013-10-22 DIAGNOSIS — R1032 Left lower quadrant pain: Secondary | ICD-10-CM | POA: Insufficient documentation

## 2013-10-22 DIAGNOSIS — M545 Low back pain, unspecified: Secondary | ICD-10-CM | POA: Insufficient documentation

## 2013-10-22 DIAGNOSIS — Z3202 Encounter for pregnancy test, result negative: Secondary | ICD-10-CM | POA: Insufficient documentation

## 2013-10-22 DIAGNOSIS — R110 Nausea: Secondary | ICD-10-CM | POA: Insufficient documentation

## 2013-10-22 DIAGNOSIS — G8929 Other chronic pain: Secondary | ICD-10-CM | POA: Insufficient documentation

## 2013-10-22 DIAGNOSIS — M549 Dorsalgia, unspecified: Secondary | ICD-10-CM

## 2013-10-22 DIAGNOSIS — Z8541 Personal history of malignant neoplasm of cervix uteri: Secondary | ICD-10-CM | POA: Insufficient documentation

## 2013-10-22 DIAGNOSIS — Z792 Long term (current) use of antibiotics: Secondary | ICD-10-CM | POA: Insufficient documentation

## 2013-10-22 DIAGNOSIS — F17200 Nicotine dependence, unspecified, uncomplicated: Secondary | ICD-10-CM | POA: Insufficient documentation

## 2013-10-22 LAB — CBC WITH DIFFERENTIAL/PLATELET
BASOS ABS: 0.1 10*3/uL (ref 0.0–0.1)
BASOS PCT: 1 % (ref 0–1)
EOS PCT: 3 % (ref 0–5)
Eosinophils Absolute: 0.3 10*3/uL (ref 0.0–0.7)
HEMATOCRIT: 40.5 % (ref 36.0–46.0)
Hemoglobin: 14.1 g/dL (ref 12.0–15.0)
Lymphocytes Relative: 23 % (ref 12–46)
Lymphs Abs: 2.9 10*3/uL (ref 0.7–4.0)
MCH: 31.3 pg (ref 26.0–34.0)
MCHC: 34.8 g/dL (ref 30.0–36.0)
MCV: 89.8 fL (ref 78.0–100.0)
MONO ABS: 0.7 10*3/uL (ref 0.1–1.0)
Monocytes Relative: 5 % (ref 3–12)
NEUTROS ABS: 8.7 10*3/uL — AB (ref 1.7–7.7)
Neutrophils Relative %: 69 % (ref 43–77)
PLATELETS: 288 10*3/uL (ref 150–400)
RBC: 4.51 MIL/uL (ref 3.87–5.11)
RDW: 13 % (ref 11.5–15.5)
WBC: 12.7 10*3/uL — ABNORMAL HIGH (ref 4.0–10.5)

## 2013-10-22 LAB — URINALYSIS, ROUTINE W REFLEX MICROSCOPIC
Bilirubin Urine: NEGATIVE
GLUCOSE, UA: NEGATIVE mg/dL
Hgb urine dipstick: NEGATIVE
Ketones, ur: NEGATIVE mg/dL
LEUKOCYTES UA: NEGATIVE
Nitrite: NEGATIVE
PH: 6.5 (ref 5.0–8.0)
PROTEIN: NEGATIVE mg/dL
SPECIFIC GRAVITY, URINE: 1.01 (ref 1.005–1.030)
Urobilinogen, UA: 0.2 mg/dL (ref 0.0–1.0)

## 2013-10-22 LAB — COMPREHENSIVE METABOLIC PANEL
ALBUMIN: 3.8 g/dL (ref 3.5–5.2)
ALT: 17 U/L (ref 0–35)
AST: 18 U/L (ref 0–37)
Alkaline Phosphatase: 89 U/L (ref 39–117)
BUN: 10 mg/dL (ref 6–23)
CALCIUM: 10 mg/dL (ref 8.4–10.5)
CO2: 29 mEq/L (ref 19–32)
CREATININE: 0.83 mg/dL (ref 0.50–1.10)
Chloride: 102 mEq/L (ref 96–112)
GFR calc Af Amer: >90 mL/min (ref >90)
GFR calc non Af Amer: 82 mL/min — ABNORMAL LOW (ref >90)
Glucose, Bld: 83 mg/dL (ref 70–99)
Potassium: 3.6 mEq/L — ABNORMAL LOW (ref 3.7–5.3)
SODIUM: 143 meq/L (ref 137–147)
Total Bilirubin: 0.5 mg/dL (ref 0.3–1.2)
Total Protein: 7.5 g/dL (ref 6.0–8.3)

## 2013-10-22 LAB — WET PREP, GENITAL
CLUE CELLS WET PREP: NONE SEEN
TRICH WET PREP: NONE SEEN
Yeast Wet Prep HPF POC: NONE SEEN

## 2013-10-22 LAB — LIPASE, BLOOD: Lipase: 22 U/L (ref 11–59)

## 2013-10-22 LAB — POC URINE PREG, ED: PREG TEST UR: NEGATIVE

## 2013-10-22 MED ORDER — HYDROCODONE-ACETAMINOPHEN 5-325 MG PO TABS: 2.0000 | ORAL_TABLET | ORAL | Status: DC | PRN

## 2013-10-22 MED ORDER — IOHEXOL 300 MG/ML  SOLN
80.0000 mL | Freq: Once | INTRAMUSCULAR | Status: AC | PRN
  Administered 2013-10-22: 80 mL via INTRAVENOUS

## 2013-10-22 MED ORDER — ONDANSETRON HCL 4 MG/2ML IJ SOLN
4.0000 mg | Freq: Once | INTRAMUSCULAR | Status: AC
  Administered 2013-10-22: 4 mg via INTRAVENOUS
  Filled 2013-10-22: qty 2

## 2013-10-22 MED ORDER — HYDROCODONE-ACETAMINOPHEN 5-325 MG PO TABS
2.0000 | ORAL_TABLET | Freq: Once | ORAL | Status: AC
  Administered 2013-10-22: 2 via ORAL
  Filled 2013-10-22: qty 2

## 2013-10-22 MED ORDER — MORPHINE SULFATE 4 MG/ML IJ SOLN
4.0000 mg | Freq: Once | INTRAMUSCULAR | Status: AC
  Administered 2013-10-22: 4 mg via INTRAVENOUS
  Filled 2013-10-22: qty 1

## 2013-10-22 MED ORDER — IOHEXOL 300 MG/ML  SOLN
25.0000 mL | Freq: Once | INTRAMUSCULAR | Status: AC | PRN
  Administered 2013-10-22: 25 mL via ORAL

## 2013-10-22 NOTE — ED Provider Notes (Signed)
CSN: [id number]     Arrival date & time 10/22/13  1900 History  This chart was scribed for non-physician practitioner Roxy Horseman, PA-C working with Gavin Pound. Oletta Lamas, MD by Joaquin Music, ED Scribe. This patient was seen in room TR10C/TR10C and the patient's care was started at 7:33 PM .   Chief Complaint  Patient presents with  . Back Pain   The history is provided by the patient. No language interpreter was used.   HPI Comments: Latoya Davenport is a 48 y.o. female who presents to the Emergency Department complaining of ongoing chronic lower back pain that radiates to LLQ that began 3 weeks ago but has worsened recently. Pt states she has been having back pain "for several years" but reports having back pain that has suddenly worsened recently. She states she feels "her back is going to lock-up on her when standing". Pt describes back pain as "stinging sensation". She states she is intermittently nauseated. Pt denies having a PCP. Pt denies bowel and bladder incontinence, diarrhea, and vaginal discharge.   Pt states she has a family hx of cervical cancer and reports being concerned.  Past Medical History  Diagnosis Date  . Back pain, chronic   . Cancer    Past Surgical History  Procedure Laterality Date  . Tubal ligation     No family history on file. History  Substance Use Topics  . Smoking status: Current Every Day Smoker    Types: Cigarettes  . Smokeless tobacco: Not on file  . Alcohol Use: No   OB History   Grav Para Term Preterm Abortions TAB SAB Ect Mult Living                 Review of Systems  Gastrointestinal: Positive for nausea and abdominal pain. Negative for diarrhea.  Genitourinary: Negative for dysuria, vaginal discharge and difficulty urinating.  Musculoskeletal: Positive for back pain.    Allergies  Sulfa antibiotics  Home Medications   Current Outpatient Rx  Name  Route  Sig  Dispense  Refill  . amoxicillin (AMOXIL) 500 MG capsule    Oral   Take 1 capsule (500 mg total) by mouth 3 (three) times daily.   21 capsule   0   . ibuprofen (ADVIL,MOTRIN) 200 MG tablet   Oral   Take 400 mg by mouth daily as needed (for tooth pain.).          Marland Kitchen oxyCODONE-acetaminophen (PERCOCET/ROXICET) 5-325 MG per tablet   Oral   Take 1 tablet by mouth every 6 (six) hours as needed for severe pain.   20 tablet   0    BP 112/77  Pulse 80  Temp(Src) 97.5 F (36.4 C) (Oral)  Resp 18  Ht 5\' 5"  (1.651 m)  Wt 151 lb 1.6 oz (68.539 kg)  BMI 25.14 kg/m2  SpO2 99%  Physical Exam  Nursing note and vitals reviewed. Constitutional: She is oriented to person, place, and time. She appears well-developed and well-nourished. No distress.  HENT:  Head: Normocephalic and atraumatic.  Eyes: EOM are normal.  Neck: Neck supple. No tracheal deviation present.  Cardiovascular: Normal rate.   Pulmonary/Chest: Effort normal. No respiratory distress.  Abdominal: Hernia confirmed negative in the right inguinal area and confirmed negative in the left inguinal area.  LLQ moderately tender to palpation . No other focal tender. No RLQ tenderness or McBurney's Point tenderness. No RUQ tenderness or Murphy's  Sign.   Genitourinary: There is no rash, tenderness, lesion or injury  on the right labia. There is no rash, tenderness or injury on the left labia. Uterus is not deviated, not enlarged, not fixed and not tender. Cervix exhibits no motion tenderness, no discharge and no friability. Right adnexum displays no mass, no tenderness and no fullness. Left adnexum displays no mass, no tenderness and no fullness. No erythema, tenderness or bleeding around the vagina. No foreign body around the vagina. No signs of injury around the vagina. No vaginal discharge found.  Pelvic exam chaperon.   Musculoskeletal: Normal range of motion.  Lumbar and paraspinal muscles are tender to palpitation, CTLS spine non-tender to papalion. No bony step-offs , abnormalities or  deformities.   Lymphadenopathy:       Right: No inguinal adenopathy present.       Left: No inguinal adenopathy present.  Neurological: She is alert and oriented to person, place, and time.  Skin: Skin is warm and dry.  Psychiatric: She has a normal mood and affect. Her behavior is normal.    ED Course  Procedures  DIAGNOSTIC STUDIES: Oxygen Saturation is 99% on RA, normal by my interpretation.    COORDINATION OF CARE: 7:38 PM-Discussed treatment plan which includes labs for further tx. Pt agreed to plan.   10:35 PM-Discussed radiology and lab findings with patient. Will discharge pt with Vicodin and will administer a dose of Vicodin while in ED. Advised pt to F/U with PCP. Pt agreed to plan.  Labs Review Labs Reviewed  WET PREP, GENITAL - Abnormal; Notable for the following:    WBC, Wet Prep HPF POC FEW (*)    All other components within normal limits  CBC WITH DIFFERENTIAL - Abnormal; Notable for the following:    WBC 12.7 (*)    Neutro Abs 8.7 (*)    All other components within normal limits  COMPREHENSIVE METABOLIC PANEL - Abnormal; Notable for the following:    Potassium 3.6 (*)    GFR calc non Af Amer 82 (*)    All other components within normal limits  GC/CHLAMYDIA PROBE AMP  LIPASE, BLOOD  URINALYSIS, ROUTINE W REFLEX MICROSCOPIC  POC URINE PREG, ED   Imaging Review Ct Abdomen Pelvis W Contrast  10/22/2013   CLINICAL DATA:  Left lower quadrant pain.  EXAM: CT ABDOMEN AND PELVIS WITH CONTRAST  TECHNIQUE: Multidetector CT imaging of the abdomen and pelvis was performed using the standard protocol following bolus administration of intravenous contrast.  CONTRAST:  80mL OMNIPAQUE IOHEXOL 300 MG/ML  SOLN  COMPARISON:  None.  FINDINGS: Dependent atelectasis in the lung bases. Heart is normal size. No effusions.  Liver, gallbladder, spleen, pancreas, adrenals and kidneys are normal.  Uterus and right adnexa unremarkable. Several small cystic areas within the left ovary and  adnexa, likely small cysts or dominant follicles. The largest measures 2 cm. Small amount of free fluid in the pelvis. Urinary bladder is unremarkable. Bilateral tubal ligation clips noted.  Appendix is visualized and is normal. Stomach, large and small bowel unremarkable. No evidence of diverticulosis or diverticulitis. Aorta is normal caliber.  No acute bony abnormality.  IMPRESSION: Normal appendix.  No acute findings in the abdomen or pelvis.  Small cysts or dominant follicles within the left ovary. Trace free fluid in the pelvis, likely physiologic.   Electronically Signed   By: Charlett Nose M.D.   On: 10/22/2013 22:26    EKG Interpretation   None      MDM   Final diagnoses:  Back pain  Abdominal pain  Patient with chief complaint of back pain, but also complaining of left lower quadrant tenderness. The back pain is concerning. I am more concerned about the left lower quadrant tenderness. This is been progressively worsening. I will check basic labs, perform pelvic exam, and will reevaluate.  Pelvic exam was unremarkable. Will proceed with CT abdomen pelvis for further evaluation of left lower quadrant tenderness. She also has a leukocytosis.  CT is unremarkable for emergent process. It does show a couple of left sided ovarian cysts. I recommend OB/GYN followup. I will give the patient some pain medicine for her back. Discharged to home in good condition.  I personally performed the services described in this documentation, which was scribed in my presence. The recorded information has been reviewed and is accurate.     Roxy Horsemanobert Yailyn Strack, PA-C 10/22/13 2239

## 2013-10-22 NOTE — ED Notes (Signed)
Pt states she is having an acute on chronic episode of back pain.  Pt states it feels like "My back is going to lock-up."

## 2013-10-22 NOTE — ED Notes (Signed)
Patient transported to CT 

## 2013-10-22 NOTE — Discharge Instructions (Signed)
Abdominal Pain, Adult °Many things can cause abdominal pain. Usually, abdominal pain is not caused by a disease and will improve without treatment. It can often be observed and treated at home. Your health care provider will do a physical exam and possibly order blood tests and X-rays to help determine the seriousness of your pain. However, in many cases, more time must pass before a clear cause of the pain can be found. Before that point, your health care provider may not know if you need more testing or further treatment. °HOME CARE INSTRUCTIONS  °Monitor your abdominal pain for any changes. The following actions may help to alleviate any discomfort you are experiencing: °· Only take over-the-counter or prescription medicines as directed by your health care provider. °· Do not take laxatives unless directed to do so by your health care provider. °· Try a clear liquid diet (broth, tea, or water) as directed by your health care provider. Slowly move to a bland diet as tolerated. °SEEK MEDICAL CARE IF: °· You have unexplained abdominal pain. °· You have abdominal pain associated with nausea or diarrhea. °· You have pain when you urinate or have a bowel movement. °· You experience abdominal pain that wakes you in the night. °· You have abdominal pain that is worsened or improved by eating food. °· You have abdominal pain that is worsened with eating fatty foods. °SEEK IMMEDIATE MEDICAL CARE IF:  °· Your pain does not go away within 2 hours. °· You have a fever. °· You keep throwing up (vomiting). °· Your pain is felt only in portions of the abdomen, such as the right side or the left lower portion of the abdomen. °· You pass bloody or black tarry stools. °MAKE SURE YOU: °· Understand these instructions.   °· Will watch your condition.   °· Will get help right away if you are not doing well or get worse.   °Document Released: 05/30/2005 Document Revised: 06/10/2013 Document Reviewed: 04/29/2013 °Exit[employer]® Patient  Information ©2014 [employer]. ° ° ° ° °Back Pain, Adult °Low back pain is very common. About 1 in 5 people have back pain. The cause of low back pain is rarely dangerous. The pain often gets better over time. About half of people with a sudden onset of back pain feel better in just 2 weeks. About 8 in 10 people feel better by 6 weeks.  °CAUSES °Some common causes of back pain include: °· Strain of the muscles or ligaments supporting the spine. °· Wear and tear (degeneration) of the spinal discs. °· Arthritis. °· Direct injury to the back. °DIAGNOSIS °Most of the time, the direct cause of low back pain is not known. However, back pain can be treated effectively even when the exact cause of the pain is unknown. Answering your caregiver's questions about your overall health and symptoms is one of the most accurate ways to make sure the cause of your pain is not dangerous. If your caregiver needs more information, he or she may order lab work or imaging tests (X-rays or MRIs). However, even if imaging tests show changes in your back, this usually does not require surgery. °HOME CARE INSTRUCTIONS °For many people, back pain returns. Since low back pain is rarely dangerous, it is often a condition that people can learn to manage on their own.  °· Remain active. It is stressful on the back to sit or stand in one place. Do not sit, drive, or stand in one place for more than 30 minutes at a time. Take short walks on level surfaces   as soon as pain allows. Try to increase the length of time you walk each day. °· Do not stay in bed. Resting more than 1 or 2 days can delay your recovery. °· Do not avoid exercise or work. Your body is made to move. It is not dangerous to be active, even though your back may hurt. Your back will likely heal faster if you return to being active before your pain is gone. °· Pay attention to your body when you  bend and lift. Many people have less discomfort when lifting if they bend their knees,  keep the load close to their bodies, and avoid twisting. Often, the most comfortable positions are those that put less stress on your recovering back. °· Find a comfortable position to sleep. Use a firm mattress and lie on your side with your knees slightly bent. If you lie on your back, put a pillow under your knees. °· Only take over-the-counter or prescription medicines as directed by your caregiver. Over-the-counter medicines to reduce pain and inflammation are often the most helpful. Your caregiver may prescribe muscle relaxant drugs. These medicines help dull your pain so you can more quickly return to your normal activities and healthy exercise. °· Put ice on the injured area. °· Put ice in a plastic bag. °· Place a towel between your skin and the bag. °· Leave the ice on for 15-20 minutes, 03-04 times a day for the first 2 to 3 days. After that, ice and heat may be alternated to reduce pain and spasms. °· Ask your caregiver about trying back exercises and gentle massage. This may be of some benefit. °· Avoid feeling anxious or stressed. Stress increases muscle tension and can worsen back pain. It is important to recognize when you are anxious or stressed and learn ways to manage it. Exercise is a great option. °SEEK MEDICAL CARE IF: °· You have pain that is not relieved with rest or medicine. °· You have pain that does not improve in 1 week. °· You have new symptoms. °· You are generally not feeling well. °SEEK IMMEDIATE MEDICAL CARE IF:  °· You have pain that radiates from your back into your legs. °· You develop new bowel or bladder control problems. °· You have unusual weakness or numbness in your arms or legs. °· You develop nausea or vomiting. °· You develop abdominal pain. °· You feel faint. °Document Released: 08/20/2005 Document Revised: 02/19/2012 Document Reviewed: 01/08/2011 °ExitCare® Patient Information ©2014 [employer]. ° °

## 2013-10-22 NOTE — ED Notes (Signed)
Pt reports pain to low back, at times goes up to thoracic spine.  Is chronic issue that began worsening approx one yr ago with periodic exacerbations, the current one began three weeks ago.  Was standing at the sink washing dishes when her left leg just felt numb.  States her left arm started shaking "like nerves".  Is concerned that she may be exhibiting signs of cervical cancer as her sister had same sx when she was dx with stage 4 cerv cancer.  Younger sister also had same sx when she was dx with stage 1 cerv ca.

## 2013-10-22 NOTE — ED Notes (Signed)
Pt originally came to ED for a flare up of her chronic back pain.  When the PA went into the pt room, she began complaining of lower abdominal pain left side.  Abdominal pain now overshadowing her back pain.

## 2013-10-22 NOTE — ED Notes (Addendum)
Returned from radiology. Assisted pt up to BR to void.  Continues to c/o pain to lower right abdomen, walking bent over.

## 2013-10-23 LAB — GC/CHLAMYDIA PROBE AMP
CT PROBE, AMP APTIMA: NEGATIVE
GC Probe RNA: NEGATIVE

## 2013-10-30 NOTE — ED Provider Notes (Signed)
Medical screening examination/treatment/procedure(s) were performed by non-physician practitioner and as supervising physician I was immediately available for consultation/collaboration.  Enslie Sahota Y. Demyah Smyre, MD 10/30/13 1042 

## 2014-04-20 ENCOUNTER — Encounter (HOSPITAL_COMMUNITY): Payer: Self-pay | Admitting: Emergency Medicine

## 2014-04-20 DIAGNOSIS — L02219 Cutaneous abscess of trunk, unspecified: Secondary | ICD-10-CM | POA: Insufficient documentation

## 2014-04-20 DIAGNOSIS — F17200 Nicotine dependence, unspecified, uncomplicated: Secondary | ICD-10-CM | POA: Insufficient documentation

## 2014-04-20 DIAGNOSIS — G8929 Other chronic pain: Secondary | ICD-10-CM | POA: Insufficient documentation

## 2014-04-20 DIAGNOSIS — L03319 Cellulitis of trunk, unspecified: Principal | ICD-10-CM

## 2014-04-20 MED ORDER — OXYCODONE-ACETAMINOPHEN 5-325 MG PO TABS
1.0000 | ORAL_TABLET | Freq: Once | ORAL | Status: AC
  Administered 2014-04-20: 1 via ORAL
  Filled 2014-04-20: qty 1

## 2014-04-20 NOTE — ED Notes (Addendum)
PT reports abscess on back drained at [facility] last night. States pain and redness are worse. PT cannot afford antibiotic given. Allergic to sulfas. Was given one at hospital last night but does not know what it is. Reports stinging and throbbing pain.

## 2014-04-21 ENCOUNTER — Emergency Department (HOSPITAL_COMMUNITY)
Admission: EM | Admit: 2014-04-21 | Discharge: 2014-04-21 | Disposition: A | Discharge status: Home / Self Care | Payer: Self-pay | Attending: Emergency Medicine | Admitting: Emergency Medicine

## 2014-04-21 DIAGNOSIS — L0390 Cellulitis, unspecified: Secondary | ICD-10-CM

## 2014-04-21 DIAGNOSIS — L0291 Cutaneous abscess, unspecified: Secondary | ICD-10-CM

## 2014-04-21 MED ORDER — CLINDAMYCIN PHOSPHATE 900 MG/50ML IV SOLN
900.0000 mg | Freq: Once | INTRAVENOUS | Status: AC
  Administered 2014-04-21: 900 mg via INTRAVENOUS
  Filled 2014-04-21: qty 50

## 2014-04-21 MED ORDER — LIDOCAINE-EPINEPHRINE 2 %-1:100000 IJ SOLN
20.0000 mL | Freq: Once | INTRAMUSCULAR | Status: DC
  Filled 2014-04-21: qty 20

## 2014-04-21 MED ORDER — OXYCODONE-ACETAMINOPHEN 5-325 MG PO TABS
2.0000 | ORAL_TABLET | ORAL | Status: DC | PRN
Start: 2014-04-21 — End: 2014-04-23

## 2014-04-21 MED ORDER — HYDROCODONE-ACETAMINOPHEN 5-325 MG PO TABS
1.0000 | ORAL_TABLET | Freq: Once | ORAL | Status: AC
  Administered 2014-04-21: 1 via ORAL
  Filled 2014-04-21: qty 1

## 2014-04-21 MED ORDER — IBUPROFEN 600 MG PO TABS: 600.0000 mg | ORAL_TABLET | Freq: Four times a day (QID) | ORAL | Status: DC | PRN

## 2014-04-21 MED ORDER — HYDROMORPHONE HCL PF 1 MG/ML IJ SOLN
1.0000 mg | Freq: Once | INTRAMUSCULAR | Status: AC
  Administered 2014-04-21: 1 mg via INTRAMUSCULAR
  Filled 2014-04-21: qty 1

## 2014-04-21 MED ORDER — IBUPROFEN 200 MG PO TABS: 600.0000 mg | ORAL_TABLET | Freq: Four times a day (QID) | ORAL | Status: DC | PRN

## 2014-04-21 MED ORDER — HYDROCODONE-ACETAMINOPHEN 5-325 MG PO TABS
1.0000 | ORAL_TABLET | ORAL | Status: DC | PRN
  Administered 2014-04-21: 1 via ORAL
  Filled 2014-04-21: qty 1

## 2014-04-21 MED ORDER — CLINDAMYCIN HCL 300 MG PO CAPS: 300.0000 mg | ORAL_CAPSULE | Freq: Four times a day (QID) | ORAL | Status: DC

## 2014-04-21 NOTE — ED Provider Notes (Addendum)
CSN: [id number]     Arrival date & time 04/20/14  2051 History   First MD Initiated Contact with Patient 04/21/14 0056     Chief Complaint  Patient presents with  . Abscess     (Consider location/radiation/quality/duration/timing/severity/associated sxs/prior Treatment) HPI Comments: Pt comes in with cc of abscess. Denies diabetes, drug abuse, immunocompromised status. Pt seen by outside hospital, abscess drained  -but overtime the swelling has increased, redness has increased, pain has increased. No n/v/f/c.   Patient is a 48 y.o. female presenting with abscess. The history is provided by the patient.  Abscess Associated symptoms: no fever, no headaches, no nausea and no vomiting     Past Medical History  Diagnosis Date  . Back pain, chronic    Past Surgical History  Procedure Laterality Date  . Tubal ligation     History reviewed. No pertinent family history. History  Substance Use Topics  . Smoking status: Current Every Day Smoker    Types: Cigarettes  . Smokeless tobacco: Not on file  . Alcohol Use: No   OB History   Grav Para Term Preterm Abortions TAB SAB Ect Mult Living                 Review of Systems  Constitutional: Negative for fever, chills and activity change.  Respiratory: Negative for shortness of breath.   Cardiovascular: Negative for chest pain.  Gastrointestinal: Negative for nausea, vomiting and abdominal pain.  Genitourinary: Negative for dysuria.  Musculoskeletal: Positive for myalgias. Negative for neck pain.  Skin: Positive for rash.  Neurological: Negative for headaches.      Allergies  Sulfa antibiotics  Home Medications   Prior to Admission medications   Medication Sig Start Date End Date Taking? Authorizing Provider  naproxen sodium (ALEVE) 220 MG tablet Take 440 mg by mouth daily as needed (pain).   Yes Historical Provider, MD   BP 89/44  Pulse 63  Temp(Src) 98 F (36.7 C) (Oral)  Resp 15  Ht 5\' 5"  (1.651 m)  Wt 150 lb  (68.04 kg)  BMI 24.96 kg/m2  SpO2 96%  LMP 01/18/2014 Physical Exam  Nursing note and vitals reviewed. Constitutional: She appears well-developed.  HENT:  Head: Atraumatic.  Eyes: Conjunctivae are normal.  Neck: Neck supple.  Cardiovascular: Normal rate.   Pulmonary/Chest: Effort normal.  Abdominal: Soft.  Skin: Skin is warm.  Patient has a large area indurated and fluctuant area, tender to palpation, with surrounding erythema.    ED Course  Procedures (including critical care time) Labs Review Labs Reviewed - No data to display  Imaging Review No results found.   EKG Interpretation None      MDM   Final diagnoses:  Cellulitis and abscess    Pt comes in with cc of abscess. Has an area in the back that appears to be abscess.  We drained the abscess -and although i was able to get some purulent discharge, it was not as much as i anticipated.  I placed ultrasound - and it appears that patient has phlegmonous changes still present. Since we were able to get abscess out, and it is possible that she is still going to possibly have more abscess - i have packed the wound.  Case management for help with antibiotics. clinda iv in the ER. Clinda -as there is cellulitis with abscess.  Will be stable for discharge.    Derwood Kaplan, MD 04/21/14 [phone]  INCISION AND DRAINAGE Performed by: Derwood Kaplan Consent: Verbal consent obtained.  Risks and benefits: risks, benefits and alternatives were discussed Type: abscess  Body area: back  Anesthesia: local infiltration  Incision was made with a scalpel.  Local anesthetic: lidocaine 1% with epinephrine  Anesthetic total: 5 ml  Complexity: complex Blunt dissection to break up loculations  Drainage: purulent  Drainage amount: 5 ml  Packing material: 1/4 in iodoform gauze  Patient tolerance: Patient tolerated the procedure well with no immediate complications.   7:54 AM Case helped with Clinda. Stable  for d/c.    Derwood KaplanAnkit Vernal Hritz, MD 04/21/14 [phone]

## 2014-04-21 NOTE — Progress Notes (Signed)
  CARE MANAGEMENT ED NOTE 04/21/2014  Patient:  Latoya Davenport,Latoya Davenport   Account Number:  [id number]  Date Initiated:  04/21/2014  Documentation initiated by:  Ferdinand CavaSCHETTINO,Xzaria Teo  Subjective/Objective Assessment:   48 yo presenting to the ED with continued problems with abscess after treatment [facility]     Subjective/Objective Assessment Detail:     Action/Plan:   Patient will fill ED discharge prescription with use of the GoodRx coupon card and encouraged to follow up with [facility] [place] or [facility].   Action/Plan Detail:   Anticipated DC Date:       Status Recommendation to Physician:   Result of Recommendation:  Agreed    DC Planning Services  CM consult  Medication Assistance  PCP issues    Choice offered to / List presented to:            Status of service:  Completed, signed off  ED Comments:   ED Comments Detail:  CM consulted to assist with medications. This CM spoke with EDP, Dr. Rhunette CroftNanavati, and discussed the antibiotic regimen that he will discharge the patient on. This CM then spoke with the patient and her spouse and discussed the regimen and the cost of the medication with the [employer] Coupon card. The patient and her spouse stated that they could afford the cost os the Clinda with the coupon. This CM also discussed importance of [facility] PCP and the [facility]. The patient stated that her and her husband have been patients at the [facility] in the past and she is working on reestablishing care there. The pamphlet for the [facility] was provided to the patient spouse along with the GoodRx coupon card and this CM contact information. Patient and spouse verbalized understanding and appreciation and had no other questions or concerns. EDP made aware of resources provided.

## 2014-04-21 NOTE — Discharge Instructions (Signed)
Return to the ER or  URGENT CARE in 2 days for wound recheck.   Abscess An abscess is an infected area that contains a collection of pus and debris.It can occur in almost any part of the body. An abscess is also known as a furuncle or boil. CAUSES  An abscess occurs when tissue gets infected. This can occur from blockage of oil or sweat glands, infection of hair follicles, or a minor injury to the skin. As the body tries to fight the infection, pus collects in the area and creates pressure under the skin. This pressure causes pain. People with weakened immune systems have difficulty fighting infections and get certain abscesses more often.  SYMPTOMS Usually an abscess develops on the skin and becomes a painful mass that is red, warm, and tender. If the abscess forms under the skin, you may feel a moveable soft area under the skin. Some abscesses break open (rupture) on their own, but most will continue to get worse without care. The infection can spread deeper into the body and eventually into the bloodstream, causing you to feel ill.  DIAGNOSIS  Your caregiver will take your medical history and perform a physical exam. A sample of fluid may also be taken from the abscess to determine what is causing your infection. TREATMENT  Your caregiver may prescribe antibiotic medicines to fight the infection. However, taking antibiotics alone usually does not cure an abscess. Your caregiver may need to make a small cut (incision) in the abscess to drain the pus. In some cases, gauze is packed into the abscess to reduce pain and to continue draining the area. HOME CARE INSTRUCTIONS   Only take over-the-counter or prescription medicines for pain, discomfort, or fever as directed by your caregiver.  If you were prescribed antibiotics, take them as directed. Finish them even if you start to feel better.  If gauze is used, follow your caregiver's directions for changing the gauze.  To avoid  spreading the infection:  Keep your draining abscess covered with a bandage.  Wash your hands well.  Do not share personal care items, towels, or whirlpools with others.  Avoid skin contact with others.  Keep your skin and clothes clean around the abscess.  Keep all follow-up appointments as directed by your caregiver. SEEK MEDICAL CARE IF:   You have increased pain, swelling, redness, fluid drainage, or bleeding.  You have muscle aches, chills, or a general ill feeling.  You have a fever. MAKE SURE YOU:   Understand these instructions.  Will watch your condition.  Will get help right away if you are not doing well or get worse. Document Released: 05/30/2005 Document Revised: 02/19/2012 Document Reviewed: 11/02/2011 [facility] Patient Information 2015 [place], [place]. This information is not intended to replace advice given to you by your health care provider. Make sure you discuss any questions you have with your health care provider.  Cellulitis Cellulitis is an infection of the skin and the tissue beneath it. The infected area is usually red and tender. Cellulitis occurs most often in the arms and lower legs.  CAUSES  Cellulitis is caused by bacteria that enter the skin through cracks or cuts in the skin. The most common types of bacteria that cause cellulitis are staphylococci and streptococci. SIGNS AND SYMPTOMS   Redness and warmth.  Swelling.  Tenderness or pain.  Fever. DIAGNOSIS  Your health care provider can usually determine what is wrong based on a physical exam. Blood tests may also be done.  TREATMENT  Treatment usually involves taking an antibiotic medicine. HOME CARE INSTRUCTIONS   Take your antibiotic medicine as directed by your health care provider. Finish the antibiotic even if you start to feel better.  Keep the infected arm or leg elevated to reduce swelling.  Apply a warm cloth to the affected area up to 4 times per day to relieve pain.  Take  medicines only as directed by your health care provider.  Keep all follow-up visits as directed by your health care provider. SEEK MEDICAL CARE IF:   You notice red streaks coming from the infected area.  Your red area gets larger or turns dark in color.  Your bone or joint underneath the infected area becomes painful after the skin has healed.  Your infection returns in the same area or another area.  You notice a swollen bump in the infected area.  You develop new symptoms.  You have a fever. SEEK IMMEDIATE MEDICAL CARE IF:   You feel very sleepy.  You develop vomiting or diarrhea.  You have a general ill feeling (malaise) with muscle aches and pains. MAKE SURE YOU:   Understand these instructions.  Will watch your condition.  Will get help right away if you are not doing well or get worse. Document Released: 05/30/2005 Document Revised: 01/04/2014 Document Reviewed: 11/05/2011 [facility] Patient Information 2015 [place], [place]. This information is not intended to replace advice given to you by your health care provider. Make sure you discuss any questions you have with your health care provider.

## 2014-04-23 ENCOUNTER — Encounter (HOSPITAL_COMMUNITY): Payer: Self-pay | Admitting: Emergency Medicine

## 2014-04-23 DIAGNOSIS — G8929 Other chronic pain: Secondary | ICD-10-CM | POA: Insufficient documentation

## 2014-04-23 DIAGNOSIS — Z792 Long term (current) use of antibiotics: Secondary | ICD-10-CM | POA: Insufficient documentation

## 2014-04-23 DIAGNOSIS — Z4801 Encounter for change or removal of surgical wound dressing: Secondary | ICD-10-CM | POA: Insufficient documentation

## 2014-04-23 DIAGNOSIS — F17200 Nicotine dependence, unspecified, uncomplicated: Secondary | ICD-10-CM | POA: Insufficient documentation

## 2014-04-23 LAB — BASIC METABOLIC PANEL
ANION GAP: 14 (ref 5–15)
BUN: 9 mg/dL (ref 6–23)
CALCIUM: 10 mg/dL (ref 8.4–10.5)
CO2: 26 mEq/L (ref 19–32)
Chloride: 100 mEq/L (ref 96–112)
Creatinine, Ser: 0.83 mg/dL (ref 0.50–1.10)
GFR, EST NON AFRICAN AMERICAN: 82 mL/min — AB (ref >90)
Glucose, Bld: 117 mg/dL — ABNORMAL HIGH (ref 70–99)
Potassium: 4.2 mEq/L (ref 3.7–5.3)
Sodium: 140 mEq/L (ref 137–147)

## 2014-04-23 LAB — CBC WITH DIFFERENTIAL/PLATELET
BASOS PCT: 1 % (ref 0–1)
Basophils Absolute: 0.1 10*3/uL (ref 0.0–0.1)
EOS ABS: 0.3 10*3/uL (ref 0.0–0.7)
Eosinophils Relative: 4 % (ref 0–5)
HEMATOCRIT: 37.6 % (ref 36.0–46.0)
Hemoglobin: 12.9 g/dL (ref 12.0–15.0)
Lymphocytes Relative: 24 % (ref 12–46)
Lymphs Abs: 2 10*3/uL (ref 0.7–4.0)
MCH: 30 pg (ref 26.0–34.0)
MCHC: 34.3 g/dL (ref 30.0–36.0)
MCV: 87.4 fL (ref 78.0–100.0)
MONO ABS: 0.3 10*3/uL (ref 0.1–1.0)
MONOS PCT: 4 % (ref 3–12)
Neutro Abs: 5.3 10*3/uL (ref 1.7–7.7)
Neutrophils Relative %: 67 % (ref 43–77)
Platelets: 285 10*3/uL (ref 150–400)
RBC: 4.3 MIL/uL (ref 3.87–5.11)
RDW: 12.9 % (ref 11.5–15.5)
WBC: 8 10*3/uL (ref 4.0–10.5)

## 2014-04-23 MED ORDER — FENTANYL CITRATE 0.05 MG/ML IJ SOLN: 50.0000 ug | Freq: Once | INTRAMUSCULAR | Status: DC

## 2014-04-23 MED ORDER — OXYCODONE-ACETAMINOPHEN 5-325 MG PO TABS
2.0000 | ORAL_TABLET | Freq: Once | ORAL | Status: AC
  Administered 2014-04-23: 2 via ORAL
  Filled 2014-04-23: qty 2

## 2014-04-23 NOTE — ED Notes (Signed)
Presents with abscess to right upper back-I&D done 2 nights ago, told to come back for wound check . Pain is severe--percocet at home is not helping, pt is tearful. Wound with redness and purulent drainage. Taking clindamycin at home. Denies fever.

## 2014-04-24 ENCOUNTER — Emergency Department (HOSPITAL_COMMUNITY)
Admission: EM | Admit: 2014-04-24 | Discharge: 2014-04-24 | Disposition: A | Discharge status: Home / Self Care | Payer: Self-pay | Attending: Emergency Medicine | Admitting: Emergency Medicine

## 2014-04-24 DIAGNOSIS — Z5189 Encounter for other specified aftercare: Secondary | ICD-10-CM

## 2014-04-24 MED ORDER — OXYCODONE-ACETAMINOPHEN 5-325 MG PO TABS
2.0000 | ORAL_TABLET | Freq: Once | ORAL | Status: AC
  Administered 2014-04-24: 2 via ORAL
  Filled 2014-04-24: qty 2

## 2014-04-24 MED ORDER — OXYCODONE-ACETAMINOPHEN 5-325 MG PO TABS: 2.0000 | ORAL_TABLET | ORAL | Status: DC | PRN

## 2014-04-24 MED ORDER — FENTANYL CITRATE 0.05 MG/ML IJ SOLN
50.0000 ug | Freq: Once | INTRAMUSCULAR | Status: AC
  Administered 2014-04-24: 50 ug via INTRAVENOUS
  Filled 2014-04-24: qty 2

## 2014-04-24 NOTE — ED Notes (Signed)
Dressing was changed on back by Benedict NeedyKate, PA-C. Preparing for discharge.

## 2014-04-24 NOTE — ED Provider Notes (Signed)
CSN: [id number]     Arrival date & time 04/23/14  2113 History   First MD Initiated Contact with Patient 04/24/14 0135     Chief Complaint  Patient presents with  . Abscess     (Consider location/radiation/quality/duration/timing/severity/associated sxs/prior Treatment) HPI Comments: Patient is a 48 year old female who presents for a wound check of her abscess that was drained 2 days ago. Patient reports taking antibiotics as directed. She is taking Percocet for pain which provides minimal relief, however, she states she takes only 1 tablet at a time. Patient reports persistent pain and purulent drainage. Palpation of the area makes the pain worse. No alleviating factors. She denies any other associated symptoms such as fever and chills.    Past Medical History  Diagnosis Date  . Back pain, chronic    Past Surgical History  Procedure Laterality Date  . Tubal ligation     History reviewed. No pertinent family history. History  Substance Use Topics  . Smoking status: Current Every Day Smoker    Types: Cigarettes  . Smokeless tobacco: Not on file  . Alcohol Use: No   OB History   Grav Para Term Preterm Abortions TAB SAB Ect Mult Living                 Review of Systems  Constitutional: Negative for fever, chills and fatigue.  HENT: Negative for trouble swallowing.   Eyes: Negative for visual disturbance.  Respiratory: Negative for shortness of breath.   Cardiovascular: Negative for chest pain and palpitations.  Gastrointestinal: Negative for nausea, vomiting, abdominal pain and diarrhea.  Genitourinary: Negative for dysuria and difficulty urinating.  Musculoskeletal: Negative for arthralgias and neck pain.  Skin: Positive for wound. Negative for color change.  Neurological: Negative for dizziness and weakness.  Psychiatric/Behavioral: Negative for dysphoric mood.      Allergies  Sulfa antibiotics  Home Medications   Prior to Admission medications   Medication Sig  Start Date End Date Taking? Authorizing Provider  clindamycin (CLEOCIN) 300 MG capsule Take 300 mg by mouth 4 (four) times daily. Started medication on 04-20-14 04/21/14  Yes Derwood KaplanAnkit Nanavati, MD  ibuprofen (ADVIL,MOTRIN) 600 MG tablet Take 600 mg by mouth every 6 (six) hours as needed for moderate pain. 04/21/14  Yes Derwood KaplanAnkit Nanavati, MD  naproxen sodium (ALEVE) 220 MG tablet Take 440 mg by mouth daily as needed (pain).   Yes Historical Provider, MD  oxyCODONE-acetaminophen (PERCOCET/ROXICET) 5-325 MG per tablet Take 2 tablets by mouth every 4 (four) hours as needed for moderate pain or severe pain. 04/21/14  Yes Ankit Nanavati, MD   BP 108/53  Pulse 65  Temp(Src) 98.2 F (36.8 C) (Oral)  Resp 22  SpO2 100%  LMP 01/18/2014 Physical Exam  Nursing note and vitals reviewed. Constitutional: She appears well-developed and well-nourished. No distress.  HENT:  Head: Normocephalic and atraumatic.  Eyes: Conjunctivae and EOM are normal.  Neck: Normal range of motion.  Cardiovascular: Normal rate and regular rhythm.  Exam reveals no gallop and no friction rub.   No murmur heard. Pulmonary/Chest: Effort normal and breath sounds normal. She has no wheezes. She has no rales. She exhibits no tenderness.  Musculoskeletal: Normal range of motion.  Neurological: She is alert.  Speech is goal-oriented. Moves limbs without ataxia.   Skin: Skin is warm and dry.     Incision wound noted of right thoracic area with packing intact. No purulent drainage. There is surrounding erythema and induration of the incision.  Psychiatric: She has a normal mood and affect. Her behavior is normal.    ED Course  Procedures (including critical care time) Labs Review Labs Reviewed  BASIC METABOLIC PANEL - Abnormal; Notable for the following:    Glucose, Bld 117 (*)    GFR calc non Af Amer 82 (*)    All other components within normal limits  CBC WITH DIFFERENTIAL    Imaging Review No results found.   EKG  Interpretation None      MDM   Final diagnoses:  Wound check, abscess    2:02 AM Packing removed from incision. No purulent drainage expressed. Patient instructed to continue clindamycin until gone. I will prescribe patient more Percocet. Patient instructed to take 2 Percocet as needed for pain for better pain control. Vitals stable and patient afebrile. Labs unremarkable for acute changes.     Emilia Beck, [place] 04/24/14 [phone]

## 2014-04-24 NOTE — ED Notes (Signed)
Unable to locate pt to recheck vitals

## 2014-04-24 NOTE — Discharge Instructions (Signed)
Continue to take Clindamycin as directed until gone. Take Percocet as needed for pain. Return to the ED with worsening or concerning symptoms.

## 2014-04-26 NOTE — ED Provider Notes (Signed)
Medical screening examination/treatment/procedure(s) were performed by non-physician practitioner and as supervising physician I was immediately available for consultation/collaboration.   EKG Interpretation None       Gean Larose M Karess Harner, MD 04/26/14 1344 

## 2014-08-01 ENCOUNTER — Emergency Department (HOSPITAL_COMMUNITY)
Admission: EM | Admit: 2014-08-01 | Discharge: 2014-08-01 | Disposition: A | Discharge status: Home / Self Care | Payer: Self-pay | Attending: Emergency Medicine | Admitting: Emergency Medicine

## 2014-08-01 ENCOUNTER — Encounter (HOSPITAL_COMMUNITY): Payer: Self-pay | Admitting: Emergency Medicine

## 2014-08-01 DIAGNOSIS — Z792 Long term (current) use of antibiotics: Secondary | ICD-10-CM | POA: Insufficient documentation

## 2014-08-01 DIAGNOSIS — G8929 Other chronic pain: Secondary | ICD-10-CM | POA: Insufficient documentation

## 2014-08-01 DIAGNOSIS — Z720 Tobacco use: Secondary | ICD-10-CM | POA: Insufficient documentation

## 2014-08-01 DIAGNOSIS — H9209 Otalgia, unspecified ear: Secondary | ICD-10-CM | POA: Insufficient documentation

## 2014-08-01 DIAGNOSIS — K029 Dental caries, unspecified: Secondary | ICD-10-CM | POA: Insufficient documentation

## 2014-08-01 MED ORDER — OXYCODONE-ACETAMINOPHEN 5-325 MG PO TABS: 1.0000 | ORAL_TABLET | ORAL | Status: DC | PRN

## 2014-08-01 MED ORDER — PENICILLIN V POTASSIUM 500 MG PO TABS: 500.0000 mg | ORAL_TABLET | Freq: Three times a day (TID) | ORAL | Status: AC

## 2014-08-01 MED ORDER — IBUPROFEN 400 MG PO TABS
400.0000 mg | ORAL_TABLET | Freq: Once | ORAL | Status: AC
  Administered 2014-08-01: 400 mg via ORAL
  Filled 2014-08-01: qty 1

## 2014-08-01 NOTE — Discharge Instructions (Signed)
Dental Caries °Dental caries (also called tooth decay) is the most common oral disease. It can occur at any age but is more common in children and young adults.  °HOW DENTAL CARIES DEVELOPS  °The process of decay begins when bacteria and foods (particularly sugars and starches) combine in your mouth to produce plaque. Plaque is a substance that sticks to the hard, outer surface of a tooth (enamel). The bacteria in plaque produce acids that attack enamel. These acids may also attack the root surface of a tooth (cementum) if it is exposed. Repeated attacks dissolve these surfaces and create holes in the tooth (cavities). If left untreated, the acids destroy the other layers of the tooth.  °RISK FACTORS °· Frequent sipping of sugary beverages.   °· Frequent snacking on sugary and starchy foods, especially those that easily get stuck in the teeth.   °· Poor oral hygiene.   °· Dry mouth.   °· Substance abuse such as methamphetamine abuse.   °· Broken or poor-fitting dental restorations.   °· Eating disorders.   °· Gastroesophageal reflux disease (GERD).   °· Certain radiation treatments to the head and neck. °SYMPTOMS °In the early stages of dental caries, symptoms are seldom present. Sometimes white, chalky areas may be seen on the enamel or other tooth layers. In later stages, symptoms may include: °· Pits and holes on the enamel. °· Toothache after sweet, hot, or cold foods or drinks are consumed. °· Pain around the tooth. °· Swelling around the tooth. °DIAGNOSIS  °Most of the time, dental caries is detected during a regular dental checkup. A diagnosis is made after a thorough medical and dental history is taken and the surfaces of your teeth are checked for signs of dental caries. Sometimes special instruments, such as lasers, are used to check for dental caries. Dental X-ray exams may be taken so that areas not visible to the eye (such as between the contact areas of the teeth) can be checked for cavities.    °TREATMENT  °If dental caries is in its early stages, it may be reversed with a fluoride treatment or an application of a remineralizing agent at the dental office. Thorough brushing and flossing at home is needed to aid these treatments. If it is in its later stages, treatment depends on the location and extent of tooth destruction:  °· If a small area of the tooth has been destroyed, the destroyed area will be removed and cavities will be filled with a material such as gold, silver amalgam, or composite resin.   °· If a large area of the tooth has been destroyed, the destroyed area will be removed and a cap (crown) will be fitted over the remaining tooth structure.   °· If the center part of the tooth (pulp) is affected, a procedure called a root canal will be needed before a filling or crown can be placed.   °· If most of the tooth has been destroyed, the tooth may need to be pulled (extracted). °HOME CARE INSTRUCTIONS °You can prevent, stop, or reverse dental caries at home by practicing good oral hygiene. Good oral hygiene includes: °· Thoroughly cleaning your teeth at least twice a day with a toothbrush and dental floss.   °· Using a fluoride toothpaste. A fluoride mouth rinse may also be used if recommended by your dentist or health care provider.   °· Restricting the amount of sugary and starchy foods and sugary liquids you consume.   °· Avoiding frequent snacking on these foods and sipping of these liquids.   °· Keeping regular visits with   a dentist for checkups and cleanings. PREVENTION   Practice good oral hygiene.  Consider a dental sealant. A dental sealant is a coating material that is applied by your dentist to the pits and grooves of teeth. The sealant prevents food from being trapped in them. It may protect the teeth for several years.  Ask about fluoride supplements if you live in a community without fluorinated water or with water that has a low fluoride content. Use fluoride supplements  as directed by your dentist or health care provider.  Allow fluoride varnish applications to teeth if directed by your dentist or health care provider. Document Released: 05/12/2002 Document Revised: 01/04/2014 Document Reviewed: 08/22/2012 [facility] Patient Information 2015 [place], [place]. This information is not intended to replace advice given to you by your health care provider. Make sure you discuss any questions you have with your health care provider.  Dental Care and Dentist Visits Dental care supports good overall health. Regular dental visits can also help you avoid dental pain, bleeding, infection, and other more serious health problems in the future. It is important to keep the mouth healthy because diseases in the teeth, gums, and other oral tissues can spread to other areas of the body. Some problems, such as diabetes, heart disease, and pre-term labor have been associated with poor oral health.  See your dentist every 6 months. If you experience emergency problems such as a toothache or broken tooth, go to the dentist right away. If you see your dentist regularly, you may catch problems early. It is easier to be treated for problems in the early stages.  WHAT TO EXPECT AT A DENTIST VISIT  Your dentist will look for many common oral health problems and recommend proper treatment. At your regular dental visit, you can expect:  Gentle cleaning of the teeth and gums. This includes scraping and polishing. This helps to remove the sticky substance around the teeth and gums (plaque). Plaque forms in the mouth shortly after eating. Over time, plaque hardens on the teeth as tartar. If tartar is not removed regularly, it can cause problems. Cleaning also helps remove stains.  Periodic X-rays. These pictures of the teeth and supporting bone will help your dentist assess the health of your teeth.  Periodic fluoride treatments. Fluoride is a natural mineral shown to help strengthen teeth. Fluoride  treatmentinvolves applying a fluoride gel or varnish to the teeth. It is most commonly done in children.  Examination of the mouth, tongue, jaws, teeth, and gums to look for any oral health problems, such as:  Cavities (dental caries). This is decay on the tooth caused by plaque, sugar, and acid in the mouth. It is best to catch a cavity when it is small.  Inflammation of the gums caused by plaque buildup (gingivitis).  Problems with the mouth or malformed or misaligned teeth.  Oral cancer or other diseases of the soft tissues or jaws. KEEP YOUR TEETH AND GUMS HEALTHY For healthy teeth and gums, follow these general guidelines as well as your dentist's specific advice:  Have your teeth professionally cleaned at the dentist every 6 months.  Brush twice daily with a fluoride toothpaste.  Floss your teeth daily.  Ask your dentist if you need fluoride supplements, treatments, or fluoride toothpaste.  Eat a healthy diet. Reduce foods and drinks with added sugar.  Avoid smoking. TREATMENT FOR ORAL HEALTH PROBLEMS If you have oral health problems, treatment varies depending on the conditions present in your teeth and gums.  Your caregiver  will most likely recommend good oral hygiene at each visit. °· For cavities, gingivitis, or other oral health disease, your caregiver will perform a procedure to treat the problem. This is typically done at a separate appointment. Sometimes your caregiver will refer you to another dental specialist for specific tooth problems or for surgery. °SEEK IMMEDIATE DENTAL CARE IF: °· You have pain, bleeding, or soreness in the gum, tooth, jaw, or mouth area. °· A permanent tooth becomes loose or separated from the gum socket. °· You experience a blow or injury to the mouth or jaw area. °Document Released: 05/02/2011 Document Revised: 11/12/2011 Document Reviewed: 05/02/2011 °ExitCare® Patient Information ©2015 [employer]. This information is not intended to  replace advice given to you by your health care provider. Make sure you discuss any questions you have with your health care provider. ° °Emergency Department Resource Guide °1) Find a Doctor and Pay Out of Pocket °Although you won't have to find out who is covered by your insurance plan, it is a good idea to ask around and get recommendations. You will then need to call the office and see if the doctor you have chosen will accept you as a new patient and what types of options they offer for patients who are self-pay. Some doctors offer discounts or will set up payment plans for their patients who do not have insurance, but you will need to ask so you aren't surprised when you get to your appointment. ° °2) Contact Your Local Health Department °Not all health departments have doctors that can see patients for sick visits, but many do, so it is worth a call to see if yours does. If you don't know where your local health department is, you can check in your phone book. The CDC also has a tool to help you locate your state's health department, and many state websites also have listings of all of their local health departments. ° °3) Find a Walk-in Clinic °If your illness is not likely to be very severe or complicated, you may want to try a walk in clinic. These are popping up all over the country in pharmacies, drugstores, and shopping centers. They're usually staffed by nurse practitioners or physician assistants that have been trained to treat common illnesses and complaints. They're usually fairly quick and inexpensive. However, if you have serious medical issues or chronic medical problems, these are probably not your best option. ° °No Primary Care Doctor: °- Call [facility] at  832-8000 - they can help you locate a primary care doctor that  accepts your insurance, provides certain services, etc. °- Physician Referral Service- 1-800-533-3463 ° °Chronic Pain Problems: °Organization         Address  Phone    Notes  °[facility]  ([phone]) [phone] Patients need to be referred by their primary care doctor.  ° °Medication Assistance: °Organization         Address  Phone   Notes  °[place] [facility] [address] °[place], [place] [address] [phone]) 641-8030 --Must be a resident of [place] °-- Must have NO insurance coverage whatsoever (no Medicaid/ Medicare, etc.) °-- The pt. MUST have a primary care doctor that directs their care regularly and follows them in the community °  °[facility]  (866) 331-1348   °[employer]  (888) 892-1162   ° °Agencies that provide inexpensive medical care: °Organization         Address  Phone     Notes  °[facility]  ([phone]) [phone]   °[facility]    (336) 832-7272   °[facility] [address] °[place], [place] [address] ([phone]) 832-4777   °[facility] [address], °[place] (336) 271-4999   °[facility]    (336) 373-0678   °[facility]    (336) 272-1050   °[facility] ° [address], [place] Phone:  (336) 832-4444, Fax:  (336) [phone] Hours of Operation:  9 am - 6 pm, M-F.  Also accepts Medicaid/Medicare and self-pay.  °[facility] ° [address], [place] Phone: (336) [phone], Fax: (336) [phone]. Hours of Operation:  8:30 am - 5:30 pm, M-F.  Also accepts Medicaid and self-pay.  °[facility] [place] [address], [place] Phone: ([phone]) [phone]   °[facility] [address], [place], [place] (336)[phone], Ext. [phone] Mondays & Thursdays: 7-9 AM.  First 15 patients are seen on a first come, first serve basis. °  ° °Medicaid-accepting [place] Providers: ° °Organization         Address  Phone   Notes  °[facility] [address], [place] ([phone]) [phone] Also accepts self-pay patients.  °[facility]  [address], [place] ° (336) [phone]   °[facility] [address], [place] (336) 288-8857   °[facility] [address] [place] [address], [place] (336) [phone]   °[facility] [address], [place]  ° ([phone]) [phone] Only accepts [place] Access Medicaid patients after they have their name applied to their card.  ° °Self-Pay (no insurance) in [place]: ° °Organization         Address  Phone   Notes  °Sickle Cell Patients, [facility] [address], [place] (336) [phone]   °[facility] [address], [place] (336) [phone]   °[facility] ° [address], [place] (336) [phone]   °[facility]/Dr. Osei-Bonsu ° [address] [place] [address], [place] or [address], [place] ([phone]) [phone] Phone number for both [place] and [place] locations is the same.  °[facility] [address], [place] (336) [phone]   °[facility] [address] [place] [address], [place] or [address] (336) 852-7530 °(336) 878-2260   °[facility] [address], [place] (336) 350-1642, phone; (336) 294-5005, fax Sees patients 1st and 3rd Saturday of every month.  Must not qualify for public or private insurance (i.e. Medicaid, Medicare, [place] Health Choice, Veterans' Benefits) • Household income should be no more than 200% of the poverty level •The clinic cannot treat you if you are pregnant or think you are pregnant • Sexually transmitted diseases are not treated at the clinic.  ° ° °Dental Care: °Organization         Address  Phone  Notes  °[place] [facility] [address],  (336) [phone] Accepts children up to age 21 who are enrolled in Medicaid or [place] Health Choice; pregnant women with a Medicaid card; and children who have  applied for Medicaid or [place] Health Choice, but were declined, whose parents can pay a reduced fee at time of service.  °[place]   [facility] [place]  [address], [place] [phone])[phone] Accepts children up to age 49 who are enrolled in [place] or [place] Health Choice; pregnant women with a Medicaid card; and children who have applied for Medicaid or [place] Health Choice, but were declined, whose parents can pay a reduced fee at time of service.  [facility]  [address], [place] [phone] Patients are seen by appointment only. Walk-ins are not accepted. [facility] will see patients 49 years of age and older. Monday - Tuesday (8am-5pm) Most Wednesdays (8:30-5pm) $30 per visit, cash only  [facility]  [address], [place] [phone] Patients are seen by appointment only. Walk-ins are not accepted. [facility] will see patients 49 years of age and older. One Wednesday Evening (Monthly: Volunteer Based).  $30 per visit, cash only  [facility]  ([phone]) [phone] for adults; Children under age 284, call Graduate Pediatric Dentistry at [phone]. Children aged 704-14, please call [phone] to request a pediatric application.  Dental services are provided in all areas of dental care including fillings, crowns and bridges, complete and partial dentures, implants, gum treatment, root canals, and extractions. Preventive care is also provided. Treatment is provided to both adults and children. Patients are selected via a lottery and there is often a waiting list.   [facility] [address], [place]  [phone] [email or web address]   [facility] [address], [place], [place] [phone], Ext. [phone] Second and Fourth Thursday of each month, opens at 6:30 AM; Clinic ends at 9 AM.  Patients are seen on a first-come first-served basis, and a  limited number are seen during each clinic.   [facility]  [address], [place], [place] [phone]   Eligibility Requirements You must have lived in [place], or [place] for at least the last three months.   You cannot be eligible for state or federal sponsored [place] insurance, including CIG[employer]eterans Administration, [place], or [employer]icare.   You generally cannot be eligible for healthcare insurance through your employer.    How to apply: Eligibility screenings are held every Tuesday and Wednesday afternoon from 1:00 pm until 4:00 pm. You do not need an appointment for the interview!  [facility] [address], [place], [place] [phone]   [facility]  [phone]   [facility]  [phone]   [facility]  [phone]

## 2014-08-01 NOTE — ED Provider Notes (Signed)
CSN: [id number]     Arrival date & time 08/01/14  1949 History   First MD Initiated Contact with Patient 08/01/14 2014     Chief Complaint  Patient presents with  . Dental Pain     (Consider location/radiation/quality/duration/timing/severity/associated sxs/prior Treatment) Patient is a 48 y.o. female presenting with tooth pain. The history is provided by the patient. No language interpreter was used.  Dental Pain Location:  Lower Lower teeth location:  28/RL 1st bicuspid and 29/RL 2nd bicuspid Quality:  Pulsating and sharp Severity:  Severe Duration:  2 days Associated symptoms: no facial swelling and no fever   Associated symptoms comment:  Pain in multiple molars that is recurrent. Pain in lower right bicuspids severe x 2 days. No fever or facial swelling.   Past Medical History  Diagnosis Date  . Back pain, chronic    Past Surgical History  Procedure Laterality Date  . Tubal ligation     No family history on file. History  Substance Use Topics  . Smoking status: Current Every Day Smoker    Types: Cigarettes  . Smokeless tobacco: Not on file  . Alcohol Use: No   OB History    No data available     Review of Systems  Constitutional: Negative for fever and chills.  HENT: Positive for dental problem and ear pain. Negative for facial swelling and trouble swallowing.   Respiratory: Negative.   Cardiovascular: Negative.   Gastrointestinal: Negative.  Negative for nausea.  Musculoskeletal: Negative.  Negative for myalgias.  Skin: Negative.   Neurological: Negative.       Allergies  Sulfa antibiotics  Home Medications   Prior to Admission medications   Medication Sig Start Date End Date Taking? Authorizing Provider  clindamycin (CLEOCIN) 300 MG capsule Take 300 mg by mouth 4 (four) times daily. Started medication on 04-20-14 04/21/14   Derwood KaplanAnkit Nanavati, MD  ibuprofen (ADVIL,MOTRIN) 600 MG tablet Take 600 mg by mouth every 6 (six) hours as needed for moderate  pain. 04/21/14   Derwood KaplanAnkit Nanavati, MD  naproxen sodium (ALEVE) 220 MG tablet Take 440 mg by mouth daily as needed (pain).    Historical Provider, MD  oxyCODONE-acetaminophen (PERCOCET/ROXICET) 5-325 MG per tablet Take 2 tablets by mouth every 4 (four) hours as needed for moderate pain or severe pain. 04/21/14   Derwood KaplanAnkit Nanavati, MD  oxyCODONE-acetaminophen (PERCOCET/ROXICET) 5-325 MG per tablet Take 2 tablets by mouth every 4 (four) hours as needed for moderate pain or severe pain. 04/24/14   Kaitlyn Szekalski, PA-C   BP 112/80 mmHg  Pulse 62  Temp(Src) 98.3 F (36.8 C) (Oral)  Resp 20  SpO2 100% Physical Exam  Constitutional: She is oriented to person, place, and time. She appears well-developed and well-nourished.  HENT:  Widespread dental decay and periodontal disease to remaining teeth. No visualized pointing abscess. No facial swelling.  Neck: Normal range of motion.  Pulmonary/Chest: Effort normal.  Lymphadenopathy:    She has no cervical adenopathy.  Neurological: She is alert and oriented to person, place, and time.  Skin: Skin is warm and dry.    ED Course  Procedures (including critical care time) Labs Review Labs Reviewed - No data to display  Imaging Review No results found.   EKG Interpretation None      MDM   Final diagnoses:  None    1. Dental caries  #'s 28 and 29 injected using 0.5% marcaine with moderate pain relief.   Will place on abx and provide pain management  and dental resources.    Arnoldo HookerShari A Ben Sanz, PA-C 08/01/14 2033  Derwood KaplanAnkit Nanavati, MD 08/01/14 [phone]

## 2014-08-01 NOTE — ED Notes (Signed)
C/o R lower toothache x 1 week.  Pain is radiating to R ear today.

## 2015-01-24 ENCOUNTER — Emergency Department (HOSPITAL_COMMUNITY)
Admission: EM | Admit: 2015-01-24 | Discharge: 2015-01-24 | Disposition: A | Discharge status: Home / Self Care | Payer: Self-pay | Attending: Emergency Medicine | Admitting: Emergency Medicine

## 2015-01-24 ENCOUNTER — Encounter (HOSPITAL_COMMUNITY): Payer: Self-pay | Admitting: *Deleted

## 2015-01-24 DIAGNOSIS — Z720 Tobacco use: Secondary | ICD-10-CM | POA: Insufficient documentation

## 2015-01-24 DIAGNOSIS — R200 Anesthesia of skin: Secondary | ICD-10-CM | POA: Insufficient documentation

## 2015-01-24 DIAGNOSIS — G8929 Other chronic pain: Secondary | ICD-10-CM | POA: Insufficient documentation

## 2015-01-24 DIAGNOSIS — Z792 Long term (current) use of antibiotics: Secondary | ICD-10-CM | POA: Insufficient documentation

## 2015-01-24 DIAGNOSIS — M5442 Lumbago with sciatica, left side: Secondary | ICD-10-CM | POA: Insufficient documentation

## 2015-01-24 MED ORDER — NAPROXEN 500 MG PO TABS
500.0000 mg | ORAL_TABLET | Freq: Two times a day (BID) | ORAL | Status: DC
Start: 2015-01-24 — End: 2015-11-17

## 2015-01-24 MED ORDER — OXYCODONE-ACETAMINOPHEN 5-325 MG PO TABS: 1.0000 | ORAL_TABLET | Freq: Four times a day (QID) | ORAL | Status: DC | PRN

## 2015-01-24 MED ORDER — METHOCARBAMOL 500 MG PO TABS
500.0000 mg | ORAL_TABLET | Freq: Once | ORAL | Status: AC
  Administered 2015-01-24: 500 mg via ORAL
  Filled 2015-01-24: qty 1

## 2015-01-24 MED ORDER — METHOCARBAMOL 500 MG PO TABS: 500.0000 mg | ORAL_TABLET | Freq: Two times a day (BID) | ORAL | Status: AC

## 2015-01-24 MED ORDER — OXYCODONE-ACETAMINOPHEN 5-325 MG PO TABS
2.0000 | ORAL_TABLET | Freq: Once | ORAL | Status: AC
  Administered 2015-01-24: 2 via ORAL
  Filled 2015-01-24: qty 2

## 2015-01-24 NOTE — ED Notes (Signed)
Declined W/C at D/C and was escorted to lobby by RN. 

## 2015-01-24 NOTE — ED Notes (Signed)
Pt with hx of chronic back pain to ED c/o increased pain x 1 week.  Pain to hips bil, L leg numbness (though no loss of bowel or bladder habits) and R arm pain.

## 2015-01-24 NOTE — Discharge Instructions (Signed)
Do not drive or operate heavy machinery for 4-6 hours after taking the Percocet and the Robaxin.

## 2015-01-24 NOTE — ED Provider Notes (Signed)
CSN: [id number]     Arrival date & time 01/24/15  1200 History  This chart was scribed for non-physician practitioner, Santiago Glad, PA-C, working with Richardean Canal, MD, by Ronney Lion, ED Scribe. This patient was seen in room TR06C/TR06C and the patient's care was started at 12:58 PM.    Chief Complaint  Patient presents with  . Back Pain   The history is provided by the patient. No language interpreter was used.     HPI Comments: Latoya Davenport is a 49 y.o. female with a history of chronic back pain who presents to the Emergency Department complaining of severe, constant, burning back pain radiating to her bilateral hips, legs, and feet. She states this pain has worsened in the past week and seems to be an exacerbation of her chronic back pain that has been ongoing for years. She complains of associated numbness in her left thigh. Patient states her pain is so severe that she needs assistance getting out of the bed in the morning. Movement, changing positions, and ambulating all exacerbate the pain. She has been taken Advil, Tylenol, and BC's Powder that has been providing only minimal relief. Patient works a very physical job that requires 12 hours of standing. She denies fever, chills, saddle anaesthesia, or bowel or bladder incontinence. She denies a history of CA or IVDA.  Patient has been trying to see a doctor but has had some delay due to insurance issues.    Past Medical History  Diagnosis Date  . Back pain, chronic    Past Surgical History  Procedure Laterality Date  . Tubal ligation     No family history on file. History  Substance Use Topics  . Smoking status: Current Every Day Smoker -- 1.00 packs/day    Types: Cigarettes  . Smokeless tobacco: Not on file  . Alcohol Use: No   OB History    No data available     Review of Systems  Constitutional: Negative for fever and chills.  Musculoskeletal: Positive for back pain.  Neurological: Positive for numbness.     Allergies  Sulfa antibiotics  Home Medications   Prior to Admission medications   Medication Sig Start Date End Date Taking? Authorizing Provider  clindamycin (CLEOCIN) 300 MG capsule Take 300 mg by mouth 4 (four) times daily. Started medication on 04-20-14 04/21/14   Derwood Kaplan, MD  ibuprofen (ADVIL,MOTRIN) 600 MG tablet Take 600 mg by mouth every 6 (six) hours as needed for moderate pain. 04/21/14   Derwood Kaplan, MD  naproxen sodium (ALEVE) 220 MG tablet Take 440 mg by mouth daily as needed (pain).    Historical Provider, MD  oxyCODONE-acetaminophen (PERCOCET/ROXICET) 5-325 MG per tablet Take 2 tablets by mouth every 4 (four) hours as needed for moderate pain or severe pain. 04/21/14   Derwood Kaplan, MD  oxyCODONE-acetaminophen (PERCOCET/ROXICET) 5-325 MG per tablet Take 1-2 tablets by mouth every 4 (four) hours as needed for severe pain. 08/01/14   Elpidio Anis, PA-C  penicillin v potassium (VEETID) 500 MG tablet Take 1 tablet (500 mg total) by mouth 3 (three) times daily. 08/01/14   Shari Upstill, PA-C   BP 102/64 mmHg  Pulse 66  Temp(Src) 97.8 F (36.6 C) (Oral)  Ht  (1.626 m)  Wt 140 lb (63.504 kg)  BMI 24.02 kg/m2  SpO2 100% Physical Exam  Constitutional: She is oriented to person, place, and time. She appears well-developed and well-nourished. No distress.  HENT:  Head: Normocephalic and atraumatic.  Eyes: Conjunctivae and EOM are normal.  Neck: Neck supple. No tracheal deviation present.  Cardiovascular: Normal rate, regular rhythm and normal heart sounds.   Pulses:      Dorsalis pedis pulses are 2+ on the right side, and 2+ on the left side.  Pulmonary/Chest: Effort normal and breath sounds normal. No respiratory distress. She has no wheezes. She has no rales. She exhibits no tenderness.  Musculoskeletal: Normal range of motion. She exhibits tenderness.  TTP of the lumbar spine. No overlying erythema or edema.  Neurological: She is alert and oriented to  person, place, and time. She has normal strength.  2+ patellar reflexes bilaterally. Distal sensation of feet intact.  Skin: Skin is warm and dry.  Psychiatric: She has a normal mood and affect. Her behavior is normal.  Nursing note and vitals reviewed.   ED Course  Procedures (including critical care time)  DIAGNOSTIC STUDIES: Oxygen Saturation is 100% on RA, normal by my interpretation.    COORDINATION OF CARE: 1:08 PM - Based on HPI and PE findings, I suspect sciatica. Discussed treatment plan with pt at bedside which includes referral to PCP and orthopedist, and short-term course of pain medication, and pt agreed to plan.  Meds ordered this encounter  Medications  . oxyCODONE-acetaminophen (PERCOCET/ROXICET) 5-325 MG per tablet 2 tablet    Sig:   . methocarbamol (ROBAXIN) tablet 500 mg    Sig:      MDM   Final diagnoses:  None   Patient with back pain.  No neurological deficits and normal neuro exam.  Patient can walk but states is painful.  No loss of bowel or bladder control.  No concern for cauda equina.  No fever, night sweats, weight loss, h/o cancer, IVDU.  RICE protocol and pain medicine indicated and discussed with patient.  Patient stable for discharge.  Return precautions given.    I personally performed the services described in this documentation, which was scribed in my presence. The recorded information has been reviewed and is accurate.     Santiago GladHeather Shivaan Tierno, PA-C 01/25/15 1334  Richardean Canalavid H Yao, MD 01/25/15 [phone]

## 2015-11-17 ENCOUNTER — Emergency Department (HOSPITAL_COMMUNITY)
Admission: EM | Admit: 2015-11-17 | Discharge: 2015-11-17 | Disposition: A | Discharge status: Home / Self Care | Payer: Worker's Compensation | Attending: Emergency Medicine | Admitting: Emergency Medicine

## 2015-11-17 ENCOUNTER — Encounter (HOSPITAL_COMMUNITY): Payer: Self-pay

## 2015-11-17 ENCOUNTER — Emergency Department (HOSPITAL_COMMUNITY): Payer: Worker's Compensation

## 2015-11-17 DIAGNOSIS — F17210 Nicotine dependence, cigarettes, uncomplicated: Secondary | ICD-10-CM | POA: Diagnosis not present

## 2015-11-17 DIAGNOSIS — G8929 Other chronic pain: Secondary | ICD-10-CM | POA: Insufficient documentation

## 2015-11-17 DIAGNOSIS — Z79899 Other long term (current) drug therapy: Secondary | ICD-10-CM | POA: Diagnosis not present

## 2015-11-17 DIAGNOSIS — Y990 Civilian activity done for income or pay: Secondary | ICD-10-CM | POA: Diagnosis not present

## 2015-11-17 DIAGNOSIS — Y9389 Activity, other specified: Secondary | ICD-10-CM | POA: Insufficient documentation

## 2015-11-17 DIAGNOSIS — Z792 Long term (current) use of antibiotics: Secondary | ICD-10-CM | POA: Diagnosis not present

## 2015-11-17 DIAGNOSIS — X58XXXA Exposure to other specified factors, initial encounter: Secondary | ICD-10-CM | POA: Insufficient documentation

## 2015-11-17 DIAGNOSIS — Y9289 Other specified places as the place of occurrence of the external cause: Secondary | ICD-10-CM | POA: Insufficient documentation

## 2015-11-17 DIAGNOSIS — S59902A Unspecified injury of left elbow, initial encounter: Secondary | ICD-10-CM | POA: Diagnosis not present

## 2015-11-17 DIAGNOSIS — S6992XA Unspecified injury of left wrist, hand and finger(s), initial encounter: Secondary | ICD-10-CM | POA: Diagnosis not present

## 2015-11-17 DIAGNOSIS — Z791 Long term (current) use of non-steroidal anti-inflammatories (NSAID): Secondary | ICD-10-CM | POA: Diagnosis not present

## 2015-11-17 DIAGNOSIS — S4992XA Unspecified injury of left shoulder and upper arm, initial encounter: Secondary | ICD-10-CM | POA: Diagnosis not present

## 2015-11-17 MED ORDER — OXYCODONE-ACETAMINOPHEN 5-325 MG PO TABS
1.0000 | ORAL_TABLET | Freq: Once | ORAL | Status: AC
  Administered 2015-11-17: 1 via ORAL
  Filled 2015-11-17: qty 1

## 2015-11-17 MED ORDER — NAPROXEN 500 MG PO TABS: 500.0000 mg | ORAL_TABLET | Freq: Two times a day (BID) | ORAL | Status: AC

## 2015-11-17 MED ORDER — OXYCODONE-ACETAMINOPHEN 5-325 MG PO TABS
ORAL_TABLET | ORAL | Status: AC
  Filled 2015-11-17: qty 1

## 2015-11-17 MED ORDER — OXYCODONE-ACETAMINOPHEN 5-325 MG PO TABS: 1.0000 | ORAL_TABLET | Freq: Four times a day (QID) | ORAL | Status: AC | PRN

## 2015-11-17 MED ORDER — OXYCODONE-ACETAMINOPHEN 5-325 MG PO TABS
1.0000 | ORAL_TABLET | Freq: Once | ORAL | Status: AC
  Administered 2015-11-17: 1 via ORAL

## 2015-11-17 NOTE — ED Notes (Signed)
Made ortho aware of need for splint

## 2015-11-17 NOTE — ED Notes (Signed)
PA at bedside.

## 2015-11-17 NOTE — ED Provider Notes (Signed)
CSN: [id number]     Arrival date & time 11/17/15  1635 History  By signing my name below, I, Tanda Rockers, attest that this documentation has been prepared under the direction and in the presence of [employer], PA-C.  Electronically Signed: Tanda Rockers, ED Scribe. 11/17/2015. 7:01 PM.    Chief Complaint  Patient presents with  . Arm Pain    The history is provided by the patient. No language interpreter was used.     HPI Comments: Latoya Davenport is a 50 y.o. female who presents to the Emergency Department complaining of sudden onset, constant, left wrist pain that occurred earlier today while at work. Pt reports that she was picking up a heavy object when she heard a popping noise in her wrist, causing the pain. She mentions that the pain now radiates into her left forearm and up into the left elbow and left shoulder. She denies causing any injury to the elbow or shoulder but family reports it is possible that pt could have hit her elbow on a stack of equipment after hearing the popping noise in wrist. She notes increased pain with movement of her left arm. She states that the pain in her elbow is worse than the pain of the wrist.  Pt also complains of numbness in her left hand and fingers. Denies weakness, tingling, or any other associated symptoms.   Past Medical History  Diagnosis Date  . Back pain, chronic    Past Surgical History  Procedure Laterality Date  . Tubal ligation     No family history on file. Social History  Substance Use Topics  . Smoking status: Current Every Day Smoker -- 1.00 packs/day    Types: Cigarettes  . Smokeless tobacco: None  . Alcohol Use: No   OB History    No data available     Review of Systems  A complete 10 system review of systems was obtained and all systems are negative except as noted in the HPI and PMH.   Allergies  Sulfa antibiotics  Home Medications   Prior to Admission medications   Medication Sig Start Date End Date  Taking? Authorizing Provider  clindamycin (CLEOCIN) 300 MG capsule Take 300 mg by mouth 4 (four) times daily. Started medication on 04-20-14 04/21/14   Derwood Kaplan, MD  ibuprofen (ADVIL,MOTRIN) 600 MG tablet Take 600 mg by mouth every 6 (six) hours as needed for moderate pain. 04/21/14   Derwood Kaplan, MD  methocarbamol (ROBAXIN) 500 MG tablet Take 1 tablet (500 mg total) by mouth 2 (two) times daily. 01/24/15   Rogan Wigley, PA-C  naproxen (NAPROSYN) 500 MG tablet Take 1 tablet (500 mg total) by mouth 2 (two) times daily. 01/24/15   Rafe Mackowski, PA-C  naproxen sodium (ALEVE) 220 MG tablet Take 440 mg by mouth daily as needed (pain).    Historical Provider, MD  oxyCODONE-acetaminophen (PERCOCET/ROXICET) 5-325 MG per tablet Take 1-2 tablets by mouth every 6 (six) hours as needed for severe pain. 01/24/15   Len Azeez, PA-C  penicillin v potassium (VEETID) 500 MG tablet Take 1 tablet (500 mg total) by mouth 3 (three) times daily. 08/01/14   Shari Upstill, PA-C   BP 115/83 mmHg  Pulse 75  Temp(Src) 97.7 F (36.5 C) (Oral)  Resp 22  SpO2 100%   Physical Exam  Constitutional: She is oriented to person, place, and time. She appears well-developed and well-nourished. No distress.  HENT:  Head: Normocephalic and atraumatic.  Eyes: Conjunctivae and EOM  are normal.  Neck: Neck supple. No tracheal deviation present.  Cardiovascular: Normal rate, regular rhythm and normal heart sounds.   Pulmonary/Chest: Effort normal and breath sounds normal. No respiratory distress. She has no wheezes. She has no rhonchi. She has no rales.  Musculoskeletal:       Left shoulder: She exhibits decreased range of motion. She exhibits no bony tenderness, no swelling and no effusion.       Left wrist: She exhibits decreased range of motion, tenderness and bony tenderness. She exhibits no swelling, no effusion and no crepitus.  Distal sensation of left hand intact. 2+ radial pulse. Swelling over the lateral  aspect of the left elbow. No erythema or bruising. ROM limited on left elbow secondary to pain.  Neurological: She is alert and oriented to person, place, and time.  Skin: Skin is warm and dry.  Psychiatric: She has a normal mood and affect. Her behavior is normal.  Nursing note and vitals reviewed.   ED Course  Procedures (including critical care time)  DIAGNOSTIC STUDIES: Oxygen Saturation is 100% on RA, normal by my interpretation.    COORDINATION OF CARE: 6:48 PM-Discussed treatment plan with pt at bedside and pt agreed to plan.   Labs Review Labs Reviewed - No data to display  Imaging Review Dg Elbow Complete Left  11/17/2015  CLINICAL DATA:  Pain. EXAM: LEFT ELBOW - COMPLETE 3+ VIEW COMPARISON:  None. FINDINGS: A large joint effusion is identified in the elbow. No fractures are seen. IMPRESSION: There is a large joint effusion. However, no visualized fracture is seen. The joint effusion suggests the possibility of an occult fracture. Given history, a soft tissue injury is also possible. Electronically Signed   By: Gerome Samavid  Williams III M.D   On: 11/17/2015 18:28   Dg Wrist Complete Left  11/17/2015  CLINICAL DATA:  Injury EXAM: LEFT WRIST - COMPLETE 3+ VIEW COMPARISON:  None. FINDINGS: No acute fracture. No dislocation.  Unremarkable soft tissues. IMPRESSION: No acute bony pathology. Electronically Signed   By: Jolaine ClickArthur  Hoss M.D.   On: 11/17/2015 18:25   Dg Shoulder Left  11/17/2015  CLINICAL DATA:  Left arm pain, minimal mobility without pain. EXAM: LEFT SHOULDER - 2+ VIEW COMPARISON:  None. FINDINGS: There is no evidence of fracture or dislocation. There is no evidence of arthropathy or other focal bone abnormality. Soft tissues are unremarkable. IMPRESSION: Negative. Electronically Signed   By: Bary RichardStan  Maynard M.D.   On: 11/17/2015 18:25   I have personally reviewed and evaluated these images as part of my medical decision-making.   EKG Interpretation None      MDM   Final  diagnoses:  None   Patient X-Ray negative for obvious fracture or dislocation.  However, did show a large joint effusion and possibility of an occult fracture.  Although, the mechanism of injury does not go along with a fracture.  Patient discussed with Dr. Judd Lienelo who recommended long arm splint and follow up with Hand Surgery.  Patient put in long arm splint and given sling.  Patient also discharged home with pain medication.   Patient will be discharged home & is agreeable with above plan. Returns precautions discussed. Pt appears safe for discharge.  I personally performed the services described in this documentation, which was scribed in my presence. The recorded information has been reviewed and is accurate.      Santiago GladHeather Cambelle Suchecki, PA-C 11/17/15 1941  Geoffery Lyonsouglas Delo, MD 11/17/15 [phone]

## 2015-11-17 NOTE — ED Notes (Signed)
Patient able to ambulate independently  

## 2015-11-17 NOTE — Progress Notes (Signed)
Orthopedic Tech Progress Note Patient Details:  Latoya Davenport 03/29/1966 [id number] Applied fiberglass posterior long arm splint to LUE.  Pulses, sensation, motion intact before and after splinting.  Capillary refill less than 2 seconds before and after splinting.  Placed splinted LUE in arm sling. Ortho Devices Type of Ortho Device: Arm sling, Post (long arm) splint Ortho Device/Splint Location: LUE Ortho Device/Splint Interventions: Application   Latoya Davenport, Latoya Davenport 11/17/2015, 7:41 PM

## 2015-11-17 NOTE — ED Notes (Signed)
PAIN MEDICATION ADMINISTERED IN TRIAGE AND INSTRUCTED NOT TO DRIVE OR DRINK ALCOHOL WITHIN 4-6 HOURS

## 2015-11-17 NOTE — ED Notes (Signed)
Pt reports she hurt her left arm today at work, pain from left elbow down to hand. She went to pick something up and heard a pop. [facility] "because I was in so much pain." [facility].

## 2016-07-19 DIAGNOSIS — R001 Bradycardia, unspecified: Secondary | ICD-10-CM

## 2016-07-19 DIAGNOSIS — R079 Chest pain, unspecified: Secondary | ICD-10-CM

## 2016-07-19 DIAGNOSIS — Z720 Tobacco use: Secondary | ICD-10-CM

## 2016-07-19 DIAGNOSIS — J439 Emphysema, unspecified: Secondary | ICD-10-CM

## 2019-04-08 DIAGNOSIS — Z01818 Encounter for other preprocedural examination: Secondary | ICD-10-CM

## 2020-08-14 DIAGNOSIS — I361 Nonrheumatic tricuspid (valve) insufficiency: Secondary | ICD-10-CM

## 2021-04-13 ENCOUNTER — Other Ambulatory Visit: Payer: Self-pay | Admitting: Family Medicine

## 2021-04-13 DIAGNOSIS — F17200 Nicotine dependence, unspecified, uncomplicated: Secondary | ICD-10-CM

## 2021-04-13 DIAGNOSIS — Z122 Encounter for screening for malignant neoplasm of respiratory organs: Secondary | ICD-10-CM

## 2021-05-25 ENCOUNTER — Ambulatory Visit
Admission: RE | Admit: 2021-05-25 | Discharge: 2021-05-25 | Disposition: A | Discharge status: Home / Self Care | Payer: BC Managed Care – PPO | Source: Ambulatory Visit | Attending: Family Medicine | Admitting: Family Medicine

## 2021-05-25 ENCOUNTER — Other Ambulatory Visit: Payer: Self-pay

## 2021-05-25 DIAGNOSIS — Z122 Encounter for screening for malignant neoplasm of respiratory organs: Secondary | ICD-10-CM

## 2021-05-25 DIAGNOSIS — F17200 Nicotine dependence, unspecified, uncomplicated: Secondary | ICD-10-CM

## 2022-07-30 IMAGING — CT CT CHEST LUNG CANCER SCREENING LOW DOSE W/O CM
1 series · 15 of 33 positions shown, 19 images · non-contrast
Comparison: 08/13/2020 CTA chest from [REDACTED].

CLINICAL DATA: Thirty-nine pack-year smoking history. Current
smoker.

EXAM:
CT CHEST WITHOUT CONTRAST LOW-DOSE FOR LUNG CANCER SCREENING
TECHNIQUE: Multidetector CT imaging of the chest was performed following the
standard protocol without IV contrast.

[Series 3: ldct screening <30 bmi · axial · 0.74mm/px · z∈[-276,+14]mm · 15 of 70 slices shown, 19 images]
[im 6/70  mediastinal]
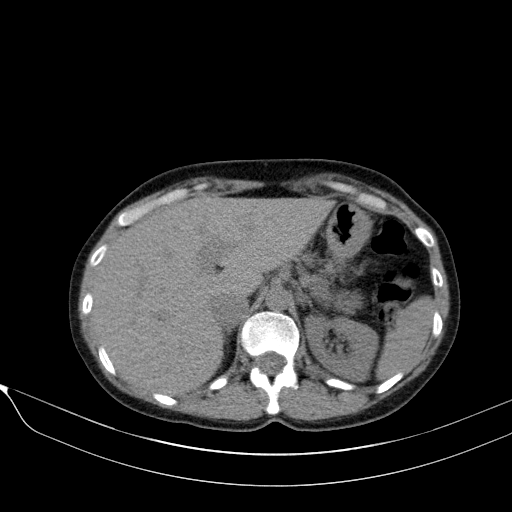
[im 6/70  lung]
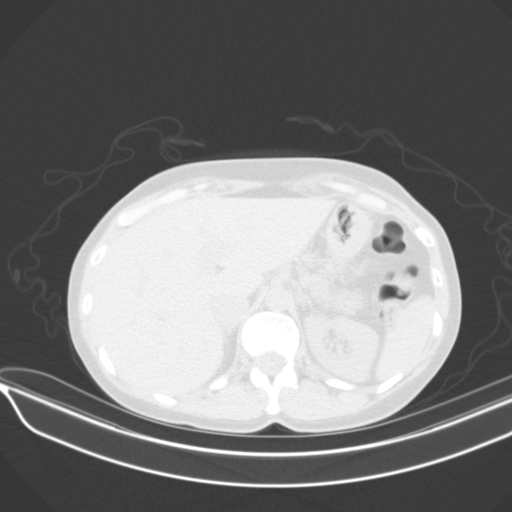
[im 11/70  lung]
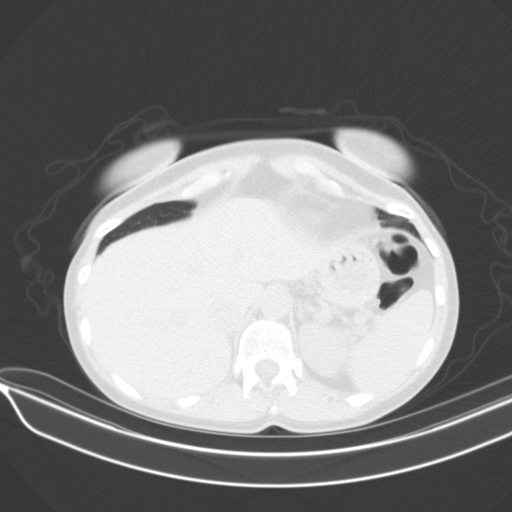
[im 14/70  lung]
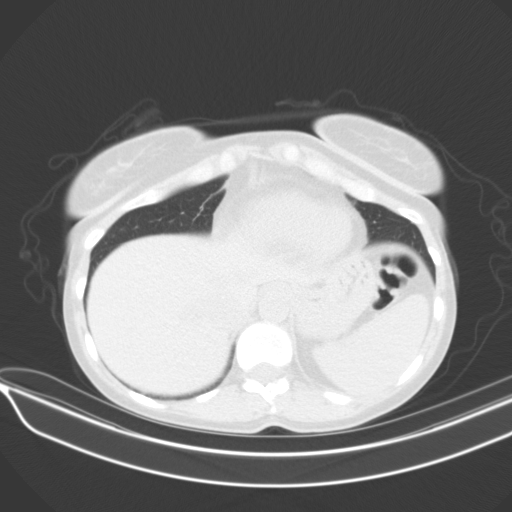
[im 18/70  lung]
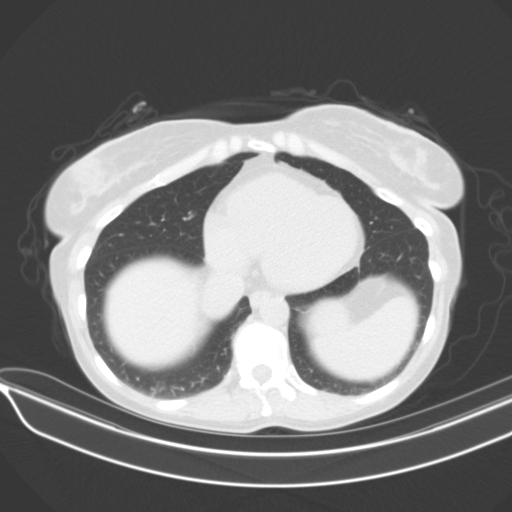
[im 24/70  mediastinal]
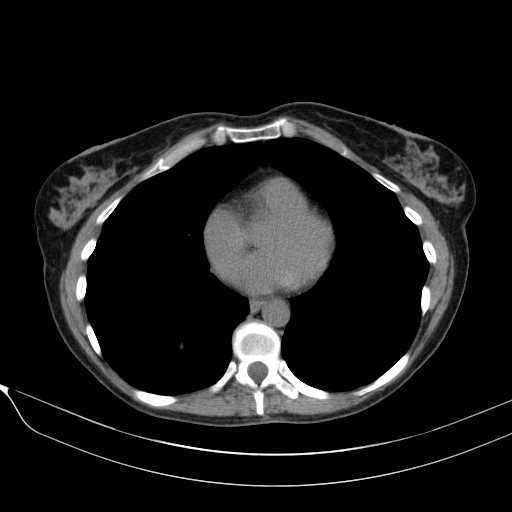
[im 24/70  lung]
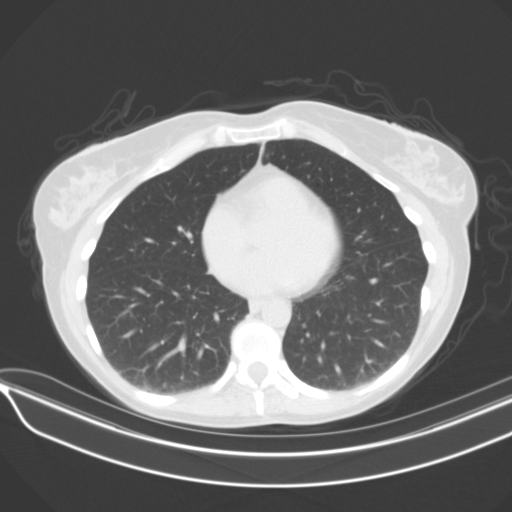
[im 28/70  lung]
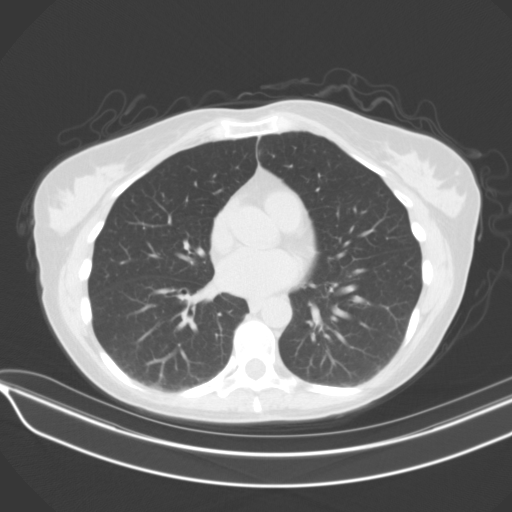
[im 31/70  lung]
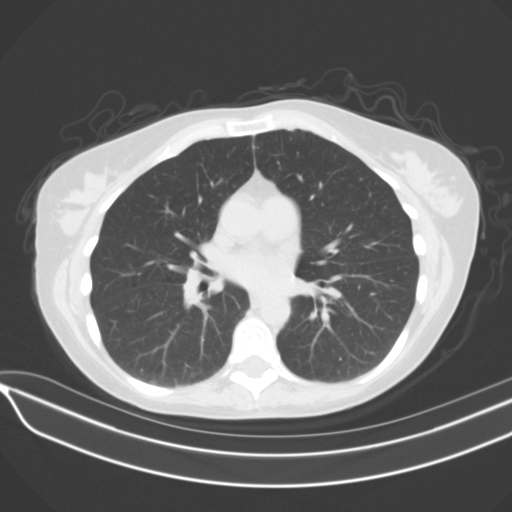
[im 36/70  lung]
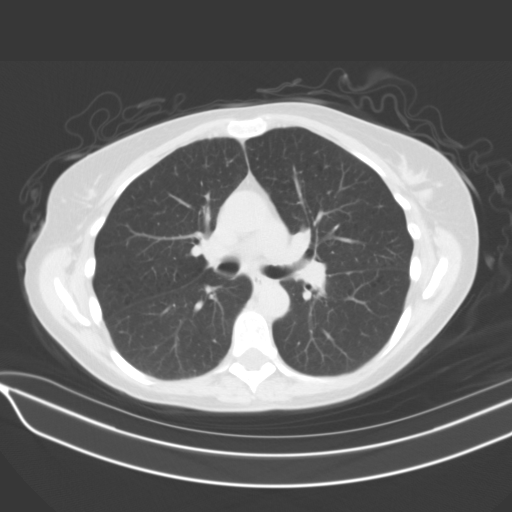
[im 39/70  mediastinal]
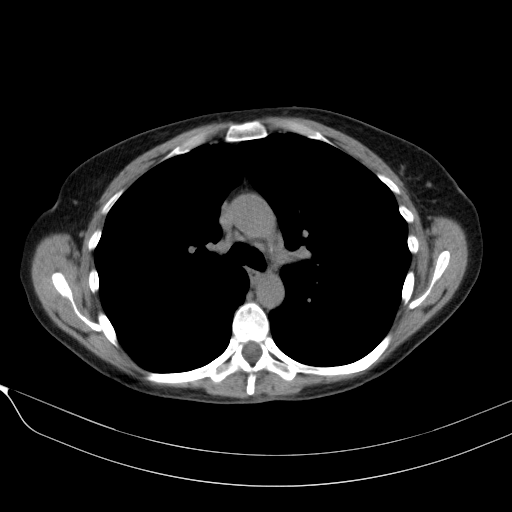
[im 39/70  lung]
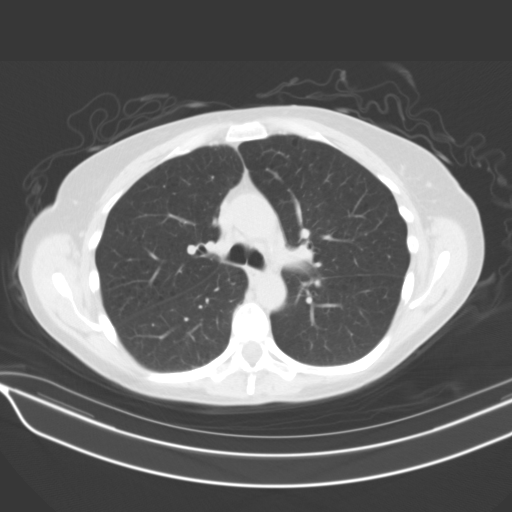
[im 42/70  lung]
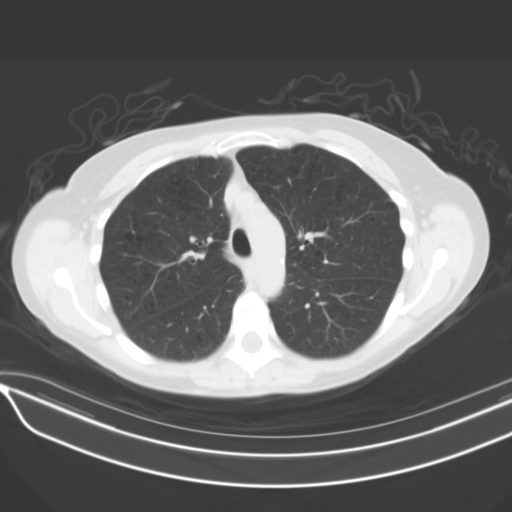
[im 47/70  lung]
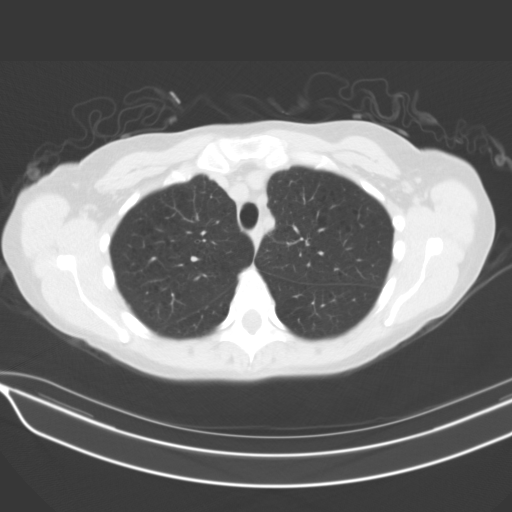
[im 52/70  lung]
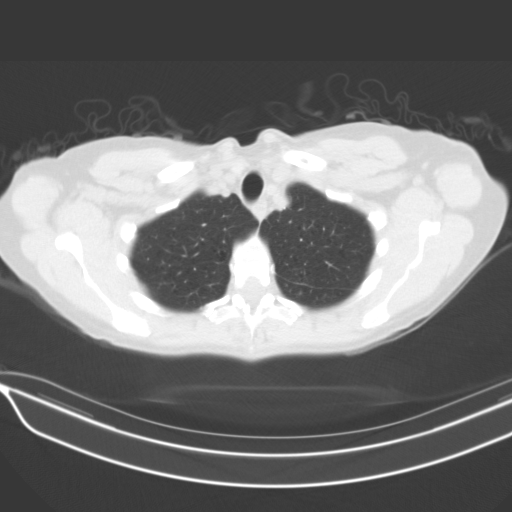
[im 56/70  mediastinal]
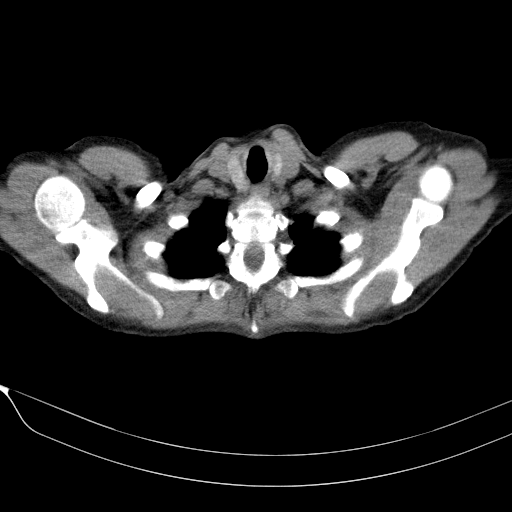
[im 56/70  lung]
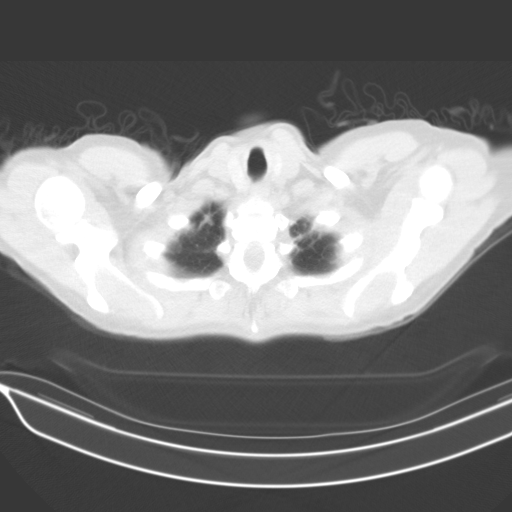
[im 59/70  lung]
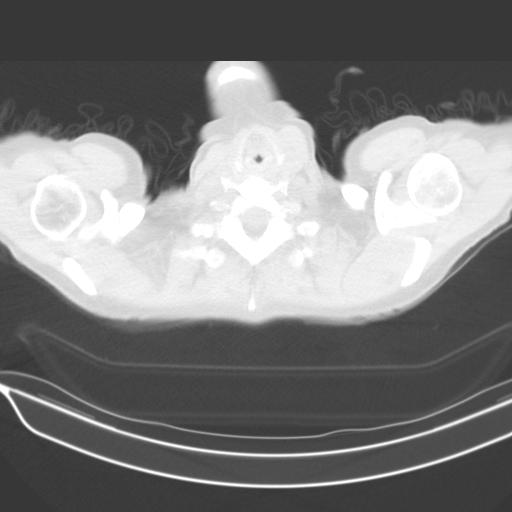
[im 64/70  lung]
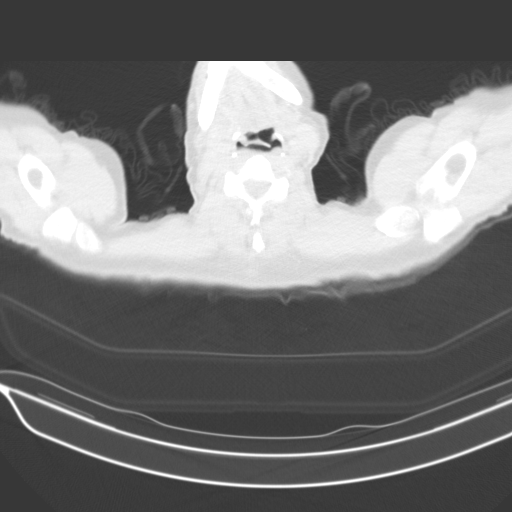

[15 of 33 positions shown; findings below may reference images not displayed]

FINDINGS: Cardiovascular: Aortic atherosclerosis. Normal heart size, without
pericardial effusion.

Mediastinum/Nodes: No mediastinal or definite hilar adenopathy,
given limitations of unenhanced CT.

Lungs/Pleura: No pleural fluid. Mild to moderate centrilobular
emphysema. Mild biapical pleuroparenchymal scarring. Right upper
lobe solid nodule of volume derived equivalent diameter 2.0 mm. A
calcified right-sided pulmonary nodule measures volume derived
equivalent diameter 2.0 mm.

Upper Abdomen: Normal imaged portions of the liver, spleen, stomach,
pancreas, adrenal glands, kidneys.

Musculoskeletal: No acute osseous abnormality.
IMPRESSION: Lung-RADS 2, benign appearance or behavior. Continue annual
screening with low-dose chest CT without contrast in 12 months.

Aortic Atherosclerosis (XKG4H-1UC.C) and Emphysema (XKG4H-XWI.A).
